# Patient Record
Sex: Male | Born: 1995 | Race: Black or African American | Hispanic: No | Marital: Single | Smoking: Current some day smoker
Health system: Southern US, Community
[De-identification: ages and names within clinical notes are randomized; demographics above are authoritative.]

## PROBLEM LIST (undated history)

## (undated) DIAGNOSIS — S62102A Fracture of unspecified carpal bone, left wrist, initial encounter for closed fracture: Secondary | ICD-10-CM

## (undated) HISTORY — PX: OTHER SURGICAL HISTORY: SHX169

---
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
## 2017-07-29 ENCOUNTER — Encounter (HOSPITAL_COMMUNITY)
Admission: RE | Disposition: A | Discharge status: Home / Self Care | Payer: Self-pay | Source: Ambulatory Visit | Attending: Orthopedic Surgery

## 2017-07-29 ENCOUNTER — Encounter (HOSPITAL_COMMUNITY)
Admission: EM | Disposition: A | Discharge status: Other Acute Inpatient Hospital | Payer: Self-pay | Source: Home / Self Care | Attending: Emergency Medicine

## 2017-07-29 ENCOUNTER — Encounter (HOSPITAL_COMMUNITY): Payer: Self-pay | Admitting: Surgery

## 2017-07-29 ENCOUNTER — Encounter (HOSPITAL_COMMUNITY): Payer: Self-pay | Admitting: Emergency Medicine

## 2017-07-29 ENCOUNTER — Emergency Department (HOSPITAL_COMMUNITY): Payer: Self-pay

## 2017-07-29 ENCOUNTER — Ambulatory Visit (HOSPITAL_COMMUNITY): Payer: Self-pay | Admitting: Anesthesiology

## 2017-07-29 ENCOUNTER — Emergency Department (HOSPITAL_COMMUNITY)
Admission: EM | Admit: 2017-07-29 | Discharge: 2017-07-29 | Payer: Self-pay | Attending: Emergency Medicine | Admitting: Emergency Medicine

## 2017-07-29 ENCOUNTER — Other Ambulatory Visit: Payer: Self-pay

## 2017-07-29 ENCOUNTER — Ambulatory Visit (HOSPITAL_COMMUNITY)
Admission: RE | Admit: 2017-07-29 | Discharge: 2017-07-29 | Disposition: A | Discharge status: Home / Self Care | Payer: Self-pay | Source: Ambulatory Visit | Attending: Orthopedic Surgery | Admitting: Orthopedic Surgery

## 2017-07-29 DIAGNOSIS — Z7722 Contact with and (suspected) exposure to environmental tobacco smoke (acute) (chronic): Secondary | ICD-10-CM | POA: Insufficient documentation

## 2017-07-29 DIAGNOSIS — W2209XA Striking against other stationary object, initial encounter: Secondary | ICD-10-CM | POA: Insufficient documentation

## 2017-07-29 DIAGNOSIS — Y999 Unspecified external cause status: Secondary | ICD-10-CM | POA: Insufficient documentation

## 2017-07-29 DIAGNOSIS — X58XXXA Exposure to other specified factors, initial encounter: Secondary | ICD-10-CM | POA: Insufficient documentation

## 2017-07-29 DIAGNOSIS — S62315A Displaced fracture of base of fourth metacarpal bone, left hand, initial encounter for closed fracture: Secondary | ICD-10-CM | POA: Insufficient documentation

## 2017-07-29 DIAGNOSIS — S62395A Other fracture of fourth metacarpal bone, left hand, initial encounter for closed fracture: Secondary | ICD-10-CM | POA: Insufficient documentation

## 2017-07-29 DIAGNOSIS — S62309A Unspecified fracture of unspecified metacarpal bone, initial encounter for closed fracture: Secondary | ICD-10-CM

## 2017-07-29 DIAGNOSIS — S62397A Other fracture of fifth metacarpal bone, left hand, initial encounter for closed fracture: Secondary | ICD-10-CM | POA: Insufficient documentation

## 2017-07-29 DIAGNOSIS — S62317A Displaced fracture of base of fifth metacarpal bone. left hand, initial encounter for closed fracture: Secondary | ICD-10-CM | POA: Insufficient documentation

## 2017-07-29 DIAGNOSIS — Y929 Unspecified place or not applicable: Secondary | ICD-10-CM | POA: Insufficient documentation

## 2017-07-29 DIAGNOSIS — Y939 Activity, unspecified: Secondary | ICD-10-CM | POA: Insufficient documentation

## 2017-07-29 HISTORY — PX: PERCUTANEOUS PINNING: SHX2209

## 2017-07-29 SURGERY — CLOSED REDUCTION, FINGER, WITH PERCUTANEOUS PINNING
Anesthesia: Choice | Laterality: Left

## 2017-07-29 SURGERY — PINNING, EXTREMITY, PERCUTANEOUS
Anesthesia: General | Site: Arm Lower | Laterality: Left

## 2017-07-29 MED ORDER — MIDAZOLAM HCL 2 MG/2ML IJ SOLN: 0.5000 mg | Freq: Once | INTRAMUSCULAR | Status: DC | PRN

## 2017-07-29 MED ORDER — GLYCOPYRROLATE 0.2 MG/ML IJ SOLN
INTRAMUSCULAR | Status: DC | PRN
  Administered 2017-07-29: 0.2 mg via INTRAVENOUS

## 2017-07-29 MED ORDER — OXYCODONE HCL 5 MG PO TABS
ORAL_TABLET | ORAL | Status: AC
  Filled 2017-07-29: qty 1

## 2017-07-29 MED ORDER — 0.9 % SODIUM CHLORIDE (POUR BTL) OPTIME
TOPICAL | Status: DC | PRN
  Administered 2017-07-29: 1000 mL

## 2017-07-29 MED ORDER — SUFENTANIL CITRATE 50 MCG/ML IV SOLN
INTRAVENOUS | Status: DC | PRN
  Administered 2017-07-29: 15 ug via INTRAVENOUS

## 2017-07-29 MED ORDER — PROPOFOL 10 MG/ML IV BOLUS
INTRAVENOUS | Status: AC
  Filled 2017-07-29: qty 20

## 2017-07-29 MED ORDER — MIDAZOLAM HCL 5 MG/5ML IJ SOLN
INTRAMUSCULAR | Status: DC | PRN
  Administered 2017-07-29 (×2): 2 mg via INTRAVENOUS

## 2017-07-29 MED ORDER — HYDROMORPHONE HCL 1 MG/ML IJ SOLN: 0.2500 mg | INTRAMUSCULAR | Status: DC | PRN

## 2017-07-29 MED ORDER — MEPERIDINE HCL 25 MG/ML IJ SOLN: 6.2500 mg | INTRAMUSCULAR | Status: DC | PRN

## 2017-07-29 MED ORDER — PROMETHAZINE HCL 25 MG/ML IJ SOLN: 6.2500 mg | INTRAMUSCULAR | Status: DC | PRN

## 2017-07-29 MED ORDER — MIDAZOLAM HCL 2 MG/2ML IJ SOLN
INTRAMUSCULAR | Status: AC
  Filled 2017-07-29: qty 2

## 2017-07-29 MED ORDER — DEXAMETHASONE SODIUM PHOSPHATE 10 MG/ML IJ SOLN
INTRAMUSCULAR | Status: DC | PRN
  Administered 2017-07-29: 10 mg via INTRAVENOUS

## 2017-07-29 MED ORDER — LACTATED RINGERS IV SOLN
INTRAVENOUS | Status: DC
  Administered 2017-07-29: 19:00:00 via INTRAVENOUS

## 2017-07-29 MED ORDER — OXYCODONE HCL 5 MG PO TABS
5.0000 mg | ORAL_TABLET | Freq: Once | ORAL | Status: AC
  Administered 2017-07-29: 5 mg via ORAL

## 2017-07-29 MED ORDER — LIDOCAINE 2% (20 MG/ML) 5 ML SYRINGE
INTRAMUSCULAR | Status: DC | PRN
  Administered 2017-07-29: 100 mg via INTRAVENOUS

## 2017-07-29 MED ORDER — SUCCINYLCHOLINE CHLORIDE 200 MG/10ML IV SOSY
PREFILLED_SYRINGE | INTRAVENOUS | Status: DC | PRN
  Administered 2017-07-29: 180 mg via INTRAVENOUS

## 2017-07-29 MED ORDER — SUFENTANIL CITRATE 50 MCG/ML IV SOLN
INTRAVENOUS | Status: AC
  Filled 2017-07-29: qty 1

## 2017-07-29 MED ORDER — PROPOFOL 10 MG/ML IV BOLUS
INTRAVENOUS | Status: DC | PRN
  Administered 2017-07-29: 100 mg via INTRAVENOUS
  Administered 2017-07-29: 200 mg via INTRAVENOUS

## 2017-07-29 MED ORDER — ONDANSETRON HCL 4 MG/2ML IJ SOLN
INTRAMUSCULAR | Status: DC | PRN
  Administered 2017-07-29: 4 mg via INTRAVENOUS

## 2017-07-29 MED ORDER — CEFAZOLIN SODIUM-DEXTROSE 2-3 GM-%(50ML) IV SOLR
INTRAVENOUS | Status: DC | PRN
  Administered 2017-07-29: 2 g via INTRAVENOUS

## 2017-07-29 SURGICAL SUPPLY — 41 items
BANDAGE ACE 3X5.8 VEL STRL LF (GAUZE/BANDAGES/DRESSINGS) ×3 IMPLANT
BANDAGE ACE 4X5 VEL STRL LF (GAUZE/BANDAGES/DRESSINGS) IMPLANT
BENZOIN TINCTURE PRP APPL 2/3 (GAUZE/BANDAGES/DRESSINGS) IMPLANT
BLADE CLIPPER SURG (BLADE) IMPLANT
BNDG GAUZE ELAST 4 BULKY (GAUZE/BANDAGES/DRESSINGS) ×12 IMPLANT
CLOSURE WOUND 1/2 X4 (GAUZE/BANDAGES/DRESSINGS)
COVER SURGICAL LIGHT HANDLE (MISCELLANEOUS) ×3 IMPLANT
CUFF TOURNIQUET SINGLE 18IN (TOURNIQUET CUFF) ×3 IMPLANT
CUFF TOURNIQUET SINGLE 24IN (TOURNIQUET CUFF) IMPLANT
DRAPE OEC MINIVIEW 54X84 (DRAPES) ×3 IMPLANT
DRSG EMULSION OIL 3X3 NADH (GAUZE/BANDAGES/DRESSINGS) IMPLANT
GAUZE SPONGE 4X4 12PLY STRL (GAUZE/BANDAGES/DRESSINGS) ×3 IMPLANT
GAUZE XEROFORM 1X8 LF (GAUZE/BANDAGES/DRESSINGS) ×6 IMPLANT
GLOVE BIOGEL M 8.0 STRL (GLOVE) ×3 IMPLANT
GLOVE SS BIOGEL STRL SZ 8 (GLOVE) ×1 IMPLANT
GLOVE SUPERSENSE BIOGEL SZ 8 (GLOVE) ×2
GOWN STRL REUS W/ TWL LRG LVL3 (GOWN DISPOSABLE) ×2 IMPLANT
GOWN STRL REUS W/ TWL XL LVL3 (GOWN DISPOSABLE) ×2 IMPLANT
GOWN STRL REUS W/TWL LRG LVL3 (GOWN DISPOSABLE) ×4
GOWN STRL REUS W/TWL XL LVL3 (GOWN DISPOSABLE) ×4
K-WIRE DBL TROCAR .062X4 ×6 IMPLANT
KIT BASIN OR (CUSTOM PROCEDURE TRAY) ×3 IMPLANT
KIT ROOM TURNOVER OR (KITS) ×3 IMPLANT
KWIRE DBL TROCAR .062X4 ×2 IMPLANT
MANIFOLD NEPTUNE II (INSTRUMENTS) ×3 IMPLANT
NS IRRIG 1000ML POUR BTL (IV SOLUTION) ×3 IMPLANT
PACK ORTHO EXTREMITY (CUSTOM PROCEDURE TRAY) ×3 IMPLANT
PAD ARMBOARD 7.5X6 YLW CONV (MISCELLANEOUS) ×6 IMPLANT
PAD CAST 4YDX4 CTTN HI CHSV (CAST SUPPLIES) ×2 IMPLANT
PADDING CAST COTTON 4X4 STRL (CAST SUPPLIES) ×4
SCOTCHCAST PLUS 2X4 WHITE (CAST SUPPLIES) ×6 IMPLANT
SCRUB BETADINE 4OZ XXX (MISCELLANEOUS) ×3 IMPLANT
SOL PREP POV-IOD 4OZ 10% (MISCELLANEOUS) ×3 IMPLANT
STRIP CLOSURE SKIN 1/2X4 (GAUZE/BANDAGES/DRESSINGS) IMPLANT
SUT ETHILON 4 0 P 3 18 (SUTURE) IMPLANT
SUT ETHILON 5 0 P 3 18 (SUTURE)
SUT NYLON ETHILON 5-0 P-3 1X18 (SUTURE) IMPLANT
SUT PROLENE 4 0 P 3 18 (SUTURE) IMPLANT
TOWEL OR 17X24 6PK STRL BLUE (TOWEL DISPOSABLE) ×3 IMPLANT
TOWEL OR 17X26 10 PK STRL BLUE (TOWEL DISPOSABLE) ×3 IMPLANT
WATER STERILE IRR 1000ML POUR (IV SOLUTION) ×3 IMPLANT

## 2017-07-29 NOTE — Transfer of Care (Signed)
Immediate Anesthesia Transfer of Care Note  Patient: Dylan Dominguez  Procedure(s) Performed: CLOSED REDUCTIONA DN PINNING/LEFT (Left Arm Lower)  Patient Location: PACU  Anesthesia Type:General  Level of Consciousness: drowsy, patient cooperative and responds to stimulation  Airway & Oxygen Therapy: Patient Spontanous Breathing  Post-op Assessment: Report given to RN, Post -op Vital signs reviewed and stable and Patient moving all extremities X 4  Post vital signs: Reviewed and stable  Last Vitals:  Vitals:   07/29/17 1843 07/29/17 2045  BP: 133/76 119/61  Pulse:  75  Resp:  (!) 9  Temp:  (!) 36.4 C  SpO2:  98%    Last Pain: There were no vitals filed for this visit.       Complications: No apparent anesthesia complications

## 2017-07-29 NOTE — Discharge Instructions (Signed)
GO TO [facility]. TELL THEM AT THE DESK THAT YOU ARE THERE FOR SURGERY ON YOUR HAND.  DO NOT EAT OR DRINK ANYTHING.

## 2017-07-29 NOTE — ED Provider Notes (Signed)
[facility]-EMERGENCY DEPT Provider Note   CSN: [id number] Arrival date & time: 07/29/17  1617     History   Chief Complaint Chief Complaint  Patient presents with  . Hand Injury    HPI Dylan Dominguez is a 21 y.o. male who is left hand dominant, presents to the ED with left hand pain. Patient states he thinks it is broken. States "I like to get mad and punch shit". C/o swelling and pain. Patient denies any other injuries. Patient reports that the injury happened 3 days ago when he hit metal pole. Patient took ibuprofen but the pain and swelling continued.  HPI  History reviewed. No pertinent past medical history.  There are no active problems to display for this patient.   History reviewed. No pertinent surgical history.     Home Medications    Prior to Admission medications   Not on File    Family History No family history on file.  Social History Social History   Tobacco Use  . Smoking status: Passive Smoke Exposure - Never Smoker  . Smokeless tobacco: Never Used  Substance Use Topics  . Alcohol use: No  . Drug use: No     Allergies   Patient has no known allergies.   Review of Systems Review of Systems  Musculoskeletal: Positive for arthralgias.       Left hand pain  All other systems reviewed and are negative.    Physical Exam Updated Vital Signs BP 124/77   Pulse (!) 54   Temp 98.8 F (37.1 C) (Oral)   Resp 16   SpO2 100%   Physical Exam  Constitutional: He appears well-developed and well-nourished. No distress.  HENT:  Head: Normocephalic and atraumatic.  Eyes: EOM are normal.  Neck: Neck supple.  Cardiovascular: Bradycardia present.  Pulmonary/Chest: Effort normal.  Musculoskeletal:       Left hand: He exhibits tenderness, bony tenderness and swelling. He exhibits normal capillary refill and no laceration. Decreased range of motion: due to pain. Normal sensation noted. Normal strength noted. He exhibits no  thumb/finger opposition.  Radial pulse 2+. Swelling and tenderness to the dorsum of the left hand.   Neurological: He is alert.  Skin: Skin is warm and dry.  Psychiatric: He has a normal mood and affect.  Nursing note and vitals reviewed.    ED Treatments / Results  Labs (all labs ordered are listed, but only abnormal results are displayed) Labs Reviewed  HEMOGLOBIN A1C    Radiology Dg Hand Complete Left  Result Date: 07/29/2017 CLINICAL DATA:  Left hand pain. EXAM: LEFT HAND - COMPLETE 3+ VIEW COMPARISON:  None. FINDINGS: There is an oblique comminuted fracture of the proximal fourth metacarpal bone. Avulsion fracture of the base of the fifth metacarpal bone is also noted. Osseous fragments are seen dorsally at the base of the fourth metacarpal bone. There is suspected dorsal subluxation. Mild irregularity of the dorsal surface of the hamate may represent an avulsion fracture and the source of the osseous fragments. There is significant dorsal soft tissue swelling. IMPRESSION: Fracture dislocation of the fourth and fifth metacarpal bones. Suspected avulsion fracture of the hamate. Electronically Signed   By: Ted Mcalpineobrinka  Dimitrova M.D.   On: 07/29/2017 17:20    Procedures Procedures (including critical care time)  Medications Ordered in ED Medications - No data to display   Initial Impression / Assessment and Plan / ED Course  I have reviewed the triage vital signs and the nursing notes.  21 y.o. male with left hand injury that occurred 3 days ago. X-ray show fracture/dislocation of the 4th and 5th MC bones on the left.   Consult with Dr. Amanda PeaGramig and [facility].  [facility]. Patient mother is driving him and will take him now. Instructions to patient not to eat or drink anything.   Final Clinical Impressions(s) / ED Diagnoses   Final diagnoses:  Fracture of metacarpal bone of left hand     ED Discharge Orders    None       Kerrie Buffaloeese, Jeronica Stlouis DatelandM, NP 07/29/17 Flossie [id number]    Benjiman CorePickering, Nathan, MD 07/29/17 2342

## 2017-07-29 NOTE — Op Note (Signed)
See full dictation [phone] Status post closed reduction and pinning fourth and fifth carpometacarpal fracture dislocations He was given Ancef preop without difficulty To be monitored in the recovery room and DC'd home. I'll see him in the office in 14 days We'll plan for pin removal at 4 weeks Hasaan Radde M.D.

## 2017-07-29 NOTE — Anesthesia Procedure Notes (Signed)
Procedure Name: Intubation Date/Time: 07/29/2017 8:04 PM Performed by: Claris Che, CRNA Pre-anesthesia Checklist: Patient identified, Emergency Drugs available, Suction available, Patient being monitored and Timeout performed Patient Re-evaluated:Patient Re-evaluated prior to induction Oxygen Delivery Method: Circle system utilized Preoxygenation: Pre-oxygenation with 100% oxygen Induction Type: IV induction, Rapid sequence and Cricoid Pressure applied Laryngoscope Size: Mac and 3 Grade View: Grade I Tube type: Oral Tube size: 8.0 mm Number of attempts: 1 Airway Equipment and Method: Stylet Placement Confirmation: ETT inserted through vocal cords under direct vision and positive ETCO2 Secured at: 24 cm Tube secured with: Tape Dental Injury: Teeth and Oropharynx as per pre-operative assessment

## 2017-07-29 NOTE — H&P (Signed)
Ronie SpiesCameron O Glassner is an 21 y.o. male.   Chief Complaint: Subacute fracture dislocation left fourth and fifth CMC joints   HPI: Patient presents for evaluation treatment fourth and fifth fracture dislocation left hand. This was sustained Friday. He notes no other injury. He is here with his family. I discussed all issues with his mother.  They note that he does not have any obvious allergies. They note that he does not typically take medicine.  The patient states she is currently not working. He is he works with his hands.  I reviewed all issues at length and the findings.  After reviewing his findings I recommended closed reduction versus open reduction and fixation is necessary given the displacement and disarray of bony architecture  History reviewed. No pertinent past medical history.  Past Surgical History:  Procedure Laterality Date  . arm fracture Right     History reviewed. No pertinent family history. Social History:  reports that he is a non-smoker but has been exposed to tobacco smoke. he has never used smokeless tobacco. He reports that he does not drink alcohol or use drugs.  Allergies: No Known Allergies  Medications Prior to Admission  Medication Sig Dispense Refill  . ibuprofen (ADVIL,MOTRIN) 200 MG tablet Take 800 mg by mouth every 6 (six) hours as needed for mild pain.      No results found for this or any previous visit (from the past 48 hour(s)). Dg Hand Complete Left  Result Date: 07/29/2017 CLINICAL DATA:  Left hand pain. EXAM: LEFT HAND - COMPLETE 3+ VIEW COMPARISON:  None. FINDINGS: There is an oblique comminuted fracture of the proximal fourth metacarpal bone. Avulsion fracture of the base of the fifth metacarpal bone is also noted. Osseous fragments are seen dorsally at the base of the fourth metacarpal bone. There is suspected dorsal subluxation. Mild irregularity of the dorsal surface of the hamate may represent an avulsion fracture and the source of the  osseous fragments. There is significant dorsal soft tissue swelling. IMPRESSION: Fracture dislocation of the fourth and fifth metacarpal bones. Suspected avulsion fracture of the hamate. Electronically Signed   By: Ted Mcalpineobrinka  Dimitrova M.D.   On: 07/29/2017 17:20    Review of Systems  Respiratory: Negative.   Cardiovascular: Negative.   Gastrointestinal: Negative.   Genitourinary: Negative.     Blood pressure 133/76. Physical Exam  Fourth and fifth fracture dislocations about the carpometacarpal joints displaced in nature. He is intact to sensation and motor function. The patient is alert and oriented in no acute distress. The patient complains of pain in the affected upper extremity.  The patient is noted to have a normal HEENT exam. Lung fields show equal chest expansion and no shortness of breath. Abdomen exam is nontender without distention. Lower extremity examination does not show any fracture dislocation or blood clot symptoms. Pelvis is stable and the neck and back are stable and nontender. Assessment/Plan We will plan for closed versus open reduction and pinning fracture dislocation fourth and fifth CMC joints. We are planning surgery for your upper extremity. The risk and benefits of surgery to include risk of bleeding, infection, anesthesia,  damage to normal structures and failure of the surgery to accomplish its intended goals of relieving symptoms and restoring function have been discussed in detail. With this in mind we plan to proceed. I have specifically discussed with the patient the pre-and postoperative regime and the dos and don'ts and risk and benefits in great detail. Risk and benefits of surgery also  include risk of dystrophy(CRPS), chronic nerve pain, failure of the healing process to go onto completion and other inherent risks of surgery The relavent the pathophysiology of the disease/injury process, as well as the alternatives for treatment and postoperative course of  action has been discussed in great detail with the patient who desires to proceed.  We will do everything in our power to help you (the patient) restore function to the upper extremity. It is a pleasure to see this patient today.  Oletta CohnWilliam M Eion Timbrook III, MD 07/29/2017, 6:56 PM

## 2017-07-29 NOTE — ED Triage Notes (Signed)
Patient here from home with complaints of left hand pain. States that he thinks it is broken. Reports "I like to get mad and punch shit". Swelling noted.

## 2017-07-29 NOTE — Anesthesia Preprocedure Evaluation (Addendum)
Anesthesia Evaluation  Patient identified by MRN, date of birth, ID band Patient awake    Reviewed: Allergy & Precautions, NPO status , Patient's Chart, lab work & pertinent test results  History of Anesthesia Complications Negative for: history of anesthetic complications  Airway Mallampati: I  TM Distance: >3 FB Neck ROM: Full    Dental  (+) Dental Advisory Given   Pulmonary Current Smoker,    breath sounds clear to auscultation       Cardiovascular negative cardio ROS   Rhythm:Regular Rate:Normal     Neuro/Psych negative neurological ROS     GI/Hepatic negative GI ROS, (+)     substance abuse  marijuana use,   Endo/Other  negative endocrine ROS  Renal/GU negative Renal ROS     Musculoskeletal   Abdominal   Peds  Hematology negative hematology ROS (+)   Anesthesia Other Findings   Reproductive/Obstetrics                            Anesthesia Physical Anesthesia Plan  ASA: II  Anesthesia Plan: General   Post-op Pain Management:    Induction: Intravenous  PONV Risk Score and Plan: 1 and Ondansetron  Airway Management Planned: Oral ETT  Additional Equipment:   Intra-op Plan:   Post-operative Plan: Extubation in OR  Informed Consent: I have reviewed the patients History and Physical, chart, labs and discussed the procedure including the risks, benefits and alternatives for the proposed anesthesia with the patient or authorized representative who has indicated his/her understanding and acceptance.   Dental advisory given  Plan Discussed with: CRNA and Surgeon  Anesthesia Plan Comments: (Plan routine monitors, GETA)        Anesthesia Quick Evaluation

## 2017-07-29 NOTE — Discharge Instructions (Signed)
Keep the cast clean and dry and elevate her arm please will see you in the office in 2 weeks. Please call my office at [phone] to see me in 2 weeks-Dr. Amanda PeaGramig  We recommend that you to take vitamin C 1000 mg a day to promote healing. We also recommend that if you require  pain medicine that you take a stool softener to prevent constipation as most pain medicines will have constipation side effects. We recommend either Peri-Colace or Senokot and recommend that you also consider adding MiraLAX as well to prevent the constipation affects from pain medicine if you are required to use them. These medicines are over the counter and may be purchased at a local pharmacy. A cup of yogurt and a probiotic can also be helpful during the recovery process as the medicines can disrupt your intestinal environment. Keep bandage clean and dry.  Call for any problems.  No smoking.  Criteria for driving a car: you should be off your pain medicine for 7-8 hours, able to drive one handed(confident), thinking clearly and feeling able in your judgement to drive. Continue elevation as it will decrease swelling.  If instructed by MD move your fingers within the confines of the bandage/splint.  Use ice if instructed by your MD. Call immediately for any sudden loss of feeling in your hand/arm or change in functional abilities of the extremity.   General Anesthesia, Adult, Care After These instructions provide you with information about caring for yourself after your procedure. Your health care provider may also give you more specific instructions. Your treatment has been planned according to current medical practices, but problems sometimes occur. Call your health care provider if you have any problems or questions after your procedure. What can I expect after the procedure? After the procedure, it is common to have:  Vomiting.  A sore throat.  Mental slowness.  It is common to feel:  Nauseous.  Cold or  shivery.  Sleepy.  Tired.  Sore or achy, even in parts of your body where you did not have surgery.  Follow these instructions at home: For at least 24 hours after the procedure:  Do not: ? Participate in activities where you could fall or become injured. ? Drive. ? Use heavy machinery. ? Drink alcohol. ? Take sleeping pills or medicines that cause drowsiness. ? Make important decisions or sign legal documents. ? Take care of children on your own.  Rest. Eating and drinking  If you vomit, drink water, juice, or soup when you can drink without vomiting.  Drink enough fluid to keep your urine clear or pale yellow.  Make sure you have little or no nausea before eating solid foods.  Follow the diet recommended by your health care provider. General instructions  Have a responsible adult stay with you until you are awake and alert.  Return to your normal activities as told by your health care provider. Ask your health care provider what activities are safe for you.  Take over-the-counter and prescription medicines only as told by your health care provider.  If you smoke, do not smoke without supervision.  Keep all follow-up visits as told by your health care provider. This is important. Contact a health care provider if:  You continue to have nausea or vomiting at home, and medicines are not helpful.  You cannot drink fluids or start eating again.  You cannot urinate after 8-12 hours.  You develop a skin rash.  You have fever.  You have increasing  redness at the site of your procedure. Get help right away if:  You have difficulty breathing.  You have chest pain.  You have unexpected bleeding.  You feel that you are having a life-threatening or urgent problem. This information is not intended to replace advice given to you by your health care provider. Make sure you discuss any questions you have with your health care provider. Document Released: 11/25/2000  Document Revised: 01/22/2016 Document Reviewed: 08/03/2015 Elsevier Interactive Patient Education  [employer]2018 [employer]

## 2017-07-30 ENCOUNTER — Encounter (HOSPITAL_COMMUNITY): Payer: Self-pay | Admitting: Orthopedic Surgery

## 2017-07-30 NOTE — Anesthesia Postprocedure Evaluation (Signed)
Anesthesia Post Note  Patient: Ronie SpiesCameron O Albert  Procedure(s) Performed: CLOSED REDUCTIONA DN PINNING/LEFT (Left Arm Lower)     Patient location during evaluation: PACU Anesthesia Type: General Level of consciousness: awake and alert, patient cooperative and oriented Pain management: pain level controlled Vital Signs Assessment: post-procedure vital signs reviewed and stable Respiratory status: spontaneous breathing, nonlabored ventilation and respiratory function stable Cardiovascular status: blood pressure returned to baseline and stable Postop Assessment: no apparent nausea or vomiting Anesthetic complications: no    Last Vitals:  Vitals:   07/29/17 2130 07/29/17 2145  BP: 115/70 122/82  Pulse: 66 (!) 55  Resp: 15 13  Temp:  36.7 C  SpO2: 100% 99%    Last Pain:  Vitals:   07/29/17 2145  PainSc: 2                  Vaness Jelinski,E. Jama Mcmiller

## 2017-07-30 NOTE — Op Note (Signed)
NAME:  Dylan Dominguez, Dylan Dominguez                   ACCOUNT NO.:  MEDICAL RECORD NO.:  [id number]  LOCATION:                                 FACILITY:  PHYSICIAN:  Dionne AnoWilliam M. Amanda PeaGramig, M.D.     DATE OF BIRTH:  DATE OF PROCEDURE: DATE OF DISCHARGE:                              OPERATIVE REPORT   PREOPERATIVE DIAGNOSIS:  Fourth and fifth carpometacarpal fracture dislocations, left hand.  POSTOPERATIVE DIAGNOSIS:  Fourth and fifth carpometacarpal fracture dislocations, left hand.  PROCEDURE: 1. Fourth carpometacarpal fracture dislocation closed reduction and     pinning. 2. Fifth carpometacarpal fracture dislocation closed reduction and     pinning, left hand/wrist. 3. Five-view radiographic series, left hand.  SURGEON:  Dionne AnoWilliam M. Amanda PeaGramig, M.D.  ASSISTANT:  None.  COMPLICATIONS:  None.  ANESTHESIA:  General.  TOURNIQUET TIME:  Zero.  INDICATIONS:  A 21 year old male with subacute fracture dislocations.  I have counseled him in regard to risks and benefits of surgery and open versus closed reduction and pinning.  He understands risks and benefits and desires to proceed.  OPERATION IN DETAIL:  The patient was seen by myself and Anesthesia. Taken to the operative theater, underwent smooth induction of general anesthetic, prepped and draped in usual sterile fashion with Hibiclens pre-scrub, followed by a Betadine scrub and paint, followed by sterile draping and time-out being observed.  He was given Ancef.  He then underwent a closed reduction.  I was able to close reduce and pin the fourth and fifth CMC fracture dislocations without difficulty utilizing two 0.062 Kirschner wires, which were bent below the skin surface and will be removed at 4 weeks postop.  Following this, I very carefully cleansed the hand and placed Xeroform, followed by a short-arm cast on him.  He had excellent refill of soft compartments and no complicating features.  He will be monitored in recovery room,  discharged to home. We will see him back in the office in 2 weeks.     Dionne AnoWilliam M. Amanda PeaGramig, M.D.     [facility]  D:  07/29/2017  T:  07/30/2017  Job:  [id number]

## 2017-11-19 ENCOUNTER — Encounter (HOSPITAL_COMMUNITY): Payer: Self-pay | Admitting: Emergency Medicine

## 2017-11-19 ENCOUNTER — Emergency Department (HOSPITAL_COMMUNITY)
Admission: EM | Admit: 2017-11-19 | Discharge: 2017-11-19 | Discharge status: Against Medical Advice | Payer: Self-pay | Attending: Emergency Medicine | Admitting: Emergency Medicine

## 2017-11-19 ENCOUNTER — Emergency Department (HOSPITAL_COMMUNITY): Payer: Self-pay

## 2017-11-19 ENCOUNTER — Other Ambulatory Visit: Payer: Self-pay

## 2017-11-19 DIAGNOSIS — S62309A Unspecified fracture of unspecified metacarpal bone, initial encounter for closed fracture: Secondary | ICD-10-CM

## 2017-11-19 DIAGNOSIS — IMO0001 Reserved for inherently not codable concepts without codable children: Secondary | ICD-10-CM

## 2017-11-19 DIAGNOSIS — Z9889 Other specified postprocedural states: Secondary | ICD-10-CM

## 2017-11-19 DIAGNOSIS — Z7722 Contact with and (suspected) exposure to environmental tobacco smoke (acute) (chronic): Secondary | ICD-10-CM | POA: Insufficient documentation

## 2017-11-19 DIAGNOSIS — Z472 Encounter for removal of internal fixation device: Secondary | ICD-10-CM | POA: Insufficient documentation

## 2017-11-19 NOTE — ED Provider Notes (Signed)
[facility] Provider Note   CSN: [id number] Arrival date & time: 11/19/17  1444     History   Chief Complaint Chief Complaint  Patient presents with  . Hand Injury  . Post-op Problem    HPI Dylan Dominguez is a 22 y.o. male presenting to the ED with foreign body left hand. Pt states he broke his hand in November, and had it fixed by Dr. Amanda PeaGramig. States every since procedure he has had a pin sticking out from his skin. He states today he bumped the side of his hand gently by accident and it began bleeding. He reports some purulent drainage. Denies F/C. Per chart review, pt is s/p closed reduction of left 4th and 5th carpometacarpal fracture/dislocation on 07/29/17 by Dr. Amanda PeaGramig. Pt states he never followed up following the procedure. Per chart review, Dr. Carlos LeveringGramig's op note stating 2 week follow up and pins removed 4 weeks from procedure date. Pt reportedly did not have Dr. Carlos LeveringGramig's contact information, nor did he make an effort to contact his office.  The history is provided by the patient.    History reviewed. No pertinent past medical history.  There are no active problems to display for this patient.   Past Surgical History:  Procedure Laterality Date  . arm fracture Right   . PERCUTANEOUS PINNING Left 07/29/2017   Procedure: CLOSED REDUCTIONA DN PINNING/LEFT;  Surgeon: Dominica SeverinGramig, William, MD;  Location: [facility] OR;  Service: Orthopedics;  Laterality: Left;       Home Medications    Prior to Admission medications   Medication Sig Start Date End Date Taking? Authorizing Provider  ibuprofen (ADVIL,MOTRIN) 200 MG tablet Take 800 mg by mouth every 6 (six) hours as needed for mild pain.    [provider]    Family History No family history on file.  Social History Social History   Tobacco Use  . Smoking status: Passive Smoke Exposure - Never Smoker  . Smokeless tobacco: Never Used  Substance Use Topics  . Alcohol use: No  .  Drug use: No     Allergies   Patient has no known allergies.   Review of Systems Review of Systems  Constitutional: Negative for fever.  Musculoskeletal: Positive for myalgias.  Skin: Positive for wound.  Neurological: Negative for numbness.     Physical Exam Updated Vital Signs BP 106/79   Pulse (!) 111   Temp 97.7 F (36.5 C) (Oral)   Ht 5\' 9"  (1.753 m)   Wt 74.8 kg (165 lb)   SpO2 94%   BMI 24.37 kg/m   Physical Exam  Constitutional: He appears well-developed and well-nourished. No distress.  HENT:  Head: Normocephalic and atraumatic.  Eyes: Conjunctivae are normal.  Cardiovascular: Intact distal pulses.  Pulmonary/Chest: Effort normal.  Musculoskeletal:       Hands: Medial aspect left hand with metal pin protruding from skin. No surrounding erythema, clear serous fluid expressed, no purulent drainage expressed. No significant tenderness or swelling. Normal ROM of digits with normal sensation and pulses.  Psychiatric: He has a normal mood and affect. His behavior is normal.  Nursing note and vitals reviewed.    ED Treatments / Results  Labs (all labs ordered are listed, but only abnormal results are displayed) Labs Reviewed - No data to display  EKG  EKG Interpretation None       Radiology Dg Hand Complete Left  Result Date: 11/19/2017 CLINICAL DATA:  Left hand surgery November 2018. Surgical pin  protruding through the skin. EXAM: LEFT HAND - COMPLETE 3+ VIEW COMPARISON:  07/29/2017 FINDINGS: Two K-wires transfixing the base of the fifth metacarpal. Interval healing of the fifth metacarpal fracture. One of the K-wires protrudes through the skin surface. Partially healed fracture of proximal fourth metacarpal shaft with persistent lucency. No other acute fracture or dislocation. IMPRESSION: 1. Two K-wires transfixing the base of the fifth metacarpal. Interval healing of the fifth metacarpal fracture. One of the K-wires protrudes through the skin surface.  2. Partially healed fracture of proximal fourth metacarpal shaft with persistent lucency. Electronically Signed   By: Elige Ko   On: 11/19/2017 18:37    Procedures Procedures (including critical care time)  Medications Ordered in ED Medications - No data to display   Initial Impression / Assessment and Plan / ED Course  I have reviewed the triage vital signs and the nursing notes.  Pertinent labs & imaging results that were available during my care of the patient were reviewed by me and considered in my medical decision making (see chart for details).  Clinical Course as of Nov 19 2004  Wed Nov 19, 2017  1945 Spoke with Steward Drone, who works with Dr. Amanda Pea. She discussed pt with Dr. Shon Baton who recommended pt report to clinic tomorrow morning to be seen by Dr. Amanda Pea. Discussed this plan with patient and he was upset that I would not remove pins in the ED today. Discussed that this was not appropriate for the ED and he would need to be seen by his orthopedic surgeon. Pt was angry and left during this discussion, before receiving discharge papers.  [JR]    Clinical Course User Index [JR] Robinson, [place] N, PA-C   Pt presenting to the ED 4 months s/p closed fixation of left 4th and 5th carpometacarpal fx done by Dr. Amanda Pea. Pt never reported for outpatient follow up following procedure for pin removal, and reports today requesting they be removed. Xray showing 2 k-wires present, 1 protruding from skin and the other is not. On exam, no evidence of infection, afebrile, no purulent drainage or erythema. NV intact. Discussed this patient with PA at Dr. Carlos Levering office, who recommended patient report to clinic first thing in the morning to be seen by Dr. Amanda Pea. Discussed this plan with patient and he was unhappy with plan, requesting pins be removed immediately in the ED. Informed patient this was a procedure that needed to be performed by his orthopedic surgeon. He left ED prior to receiving  discharge paperwork, but stated he now had contact information for his clinic to follow up.   Discussed results, findings, treatment and follow up. Patient advised of return precautions. Patient verbalized understanding and agreed with plan.  Final Clinical Impressions(s) / ED Diagnoses   Final diagnoses:  Status post closed reduction with internal fixation  Fracture of metacarpal bone of left hand    ED Discharge Orders    None       Robinson, [place] N, PA-C 11/19/17 2006    Knapp, Jon, MD 11/20/17 1002

## 2017-11-19 NOTE — ED Notes (Signed)
Pt called for triage. No answer.

## 2017-11-19 NOTE — ED Triage Notes (Signed)
Pt in from home with c/o L hand post-op injury. States he had surgery by Dr. Amanda PeaGramig in December. Yesterday at work, pt states his L lateral hand started bleeding and pin appeared to be protruding from skin. VSS, bleeding controlled

## 2017-11-19 NOTE — ED Notes (Signed)
EDP at bedside. Patient leaving without his discharge papers. NAD noted. Patient ambulatory with steady gait.

## 2017-11-20 ENCOUNTER — Encounter (HOSPITAL_COMMUNITY)
Admission: RE | Disposition: A | Discharge status: Home / Self Care | Payer: Self-pay | Source: Ambulatory Visit | Attending: Orthopedic Surgery

## 2017-11-20 ENCOUNTER — Ambulatory Visit (HOSPITAL_COMMUNITY)
Admission: RE | Admit: 2017-11-20 | Discharge: 2017-11-20 | Disposition: A | Discharge status: Home / Self Care | Payer: Self-pay | Source: Ambulatory Visit | Attending: Orthopedic Surgery | Admitting: Orthopedic Surgery

## 2017-11-20 ENCOUNTER — Ambulatory Visit (HOSPITAL_COMMUNITY): Payer: Self-pay | Admitting: Anesthesiology

## 2017-11-20 ENCOUNTER — Encounter (HOSPITAL_COMMUNITY): Payer: Self-pay | Admitting: *Deleted

## 2017-11-20 DIAGNOSIS — Y838 Other surgical procedures as the cause of abnormal reaction of the patient, or of later complication, without mention of misadventure at the time of the procedure: Secondary | ICD-10-CM | POA: Insufficient documentation

## 2017-11-20 DIAGNOSIS — F17210 Nicotine dependence, cigarettes, uncomplicated: Secondary | ICD-10-CM | POA: Insufficient documentation

## 2017-11-20 DIAGNOSIS — T84290A Other mechanical complication of internal fixation device of bones of hand and fingers, initial encounter: Secondary | ICD-10-CM | POA: Insufficient documentation

## 2017-11-20 HISTORY — PX: I&D EXTREMITY: SHX5045

## 2017-11-20 HISTORY — DX: Fracture of unspecified carpal bone, left wrist, initial encounter for closed fracture: S62.102A

## 2017-11-20 HISTORY — PX: HARDWARE REMOVAL: SHX979

## 2017-11-20 LAB — CBC
HCT: 39.7 % (ref 39.0–52.0)
HEMOGLOBIN: 12.6 g/dL — AB (ref 13.0–17.0)
MCH: 23.9 pg — ABNORMAL LOW (ref 26.0–34.0)
MCHC: 31.7 g/dL (ref 30.0–36.0)
MCV: 75.3 fL — AB (ref 78.0–100.0)
PLATELETS: 132 10*3/uL — AB (ref 150–400)
RBC: 5.27 MIL/uL (ref 4.22–5.81)
RDW: 14.5 % (ref 11.5–15.5)
WBC: 6.8 10*3/uL (ref 4.0–10.5)

## 2017-11-20 SURGERY — IRRIGATION AND DEBRIDEMENT EXTREMITY
Anesthesia: General | Site: Hand | Laterality: Left

## 2017-11-20 MED ORDER — OXYCODONE HCL 5 MG/5ML PO SOLN: 5.0000 mg | Freq: Once | ORAL | Status: DC | PRN

## 2017-11-20 MED ORDER — CEFAZOLIN SODIUM-DEXTROSE 2-4 GM/100ML-% IV SOLN
2.0000 g | Freq: Once | INTRAVENOUS | Status: AC
  Administered 2017-11-20: 2 g via INTRAVENOUS

## 2017-11-20 MED ORDER — LACTATED RINGERS IV SOLN
INTRAVENOUS | Status: DC
Start: 2017-11-20 — End: 2017-11-20
  Administered 2017-11-20 (×2): via INTRAVENOUS

## 2017-11-20 MED ORDER — LIDOCAINE 2% (20 MG/ML) 5 ML SYRINGE
INTRAMUSCULAR | Status: DC | PRN
  Administered 2017-11-20: 100 mg via INTRAVENOUS

## 2017-11-20 MED ORDER — OXYCODONE HCL 5 MG PO TABS: 5.0000 mg | ORAL_TABLET | Freq: Once | ORAL | Status: DC | PRN

## 2017-11-20 MED ORDER — SODIUM CHLORIDE 0.9 % IR SOLN
Status: DC | PRN
  Administered 2017-11-20: 1000 mL

## 2017-11-20 MED ORDER — CEFAZOLIN SODIUM-DEXTROSE 2-4 GM/100ML-% IV SOLN
INTRAVENOUS | Status: AC
  Filled 2017-11-20: qty 100

## 2017-11-20 MED ORDER — PROPOFOL 10 MG/ML IV BOLUS
INTRAVENOUS | Status: DC | PRN
  Administered 2017-11-20: 200 mg via INTRAVENOUS

## 2017-11-20 MED ORDER — BUPIVACAINE HCL (PF) 0.25 % IJ SOLN
INTRAMUSCULAR | Status: AC
  Filled 2017-11-20: qty 10

## 2017-11-20 MED ORDER — HYDROMORPHONE HCL 1 MG/ML IJ SOLN
0.2500 mg | INTRAMUSCULAR | Status: DC | PRN
  Administered 2017-11-20 (×2): 0.25 mg via INTRAVENOUS

## 2017-11-20 MED ORDER — HYDROMORPHONE HCL 1 MG/ML IJ SOLN
INTRAMUSCULAR | Status: AC
  Administered 2017-11-20: 0.25 mg via INTRAVENOUS
  Filled 2017-11-20: qty 1

## 2017-11-20 MED ORDER — FENTANYL CITRATE (PF) 100 MCG/2ML IJ SOLN
INTRAMUSCULAR | Status: DC | PRN
  Administered 2017-11-20: 100 ug via INTRAVENOUS

## 2017-11-20 MED ORDER — PROMETHAZINE HCL 25 MG/ML IJ SOLN: 6.2500 mg | INTRAMUSCULAR | Status: DC | PRN

## 2017-11-20 MED ORDER — MEPERIDINE HCL 50 MG/ML IJ SOLN: 6.2500 mg | INTRAMUSCULAR | Status: DC | PRN

## 2017-11-20 SURGICAL SUPPLY — 60 items
BANDAGE ACE 3X5.8 VEL STRL LF (GAUZE/BANDAGES/DRESSINGS) IMPLANT
BANDAGE ACE 4X5 VEL STRL LF (GAUZE/BANDAGES/DRESSINGS) ×3 IMPLANT
BANDAGE ELASTIC 3 VELCRO ST LF (GAUZE/BANDAGES/DRESSINGS) ×3 IMPLANT
BNDG COHESIVE 1X5 TAN STRL LF (GAUZE/BANDAGES/DRESSINGS) IMPLANT
BNDG CONFORM 2 STRL LF (GAUZE/BANDAGES/DRESSINGS) IMPLANT
BNDG GAUZE ELAST 4 BULKY (GAUZE/BANDAGES/DRESSINGS) ×3 IMPLANT
CAP PIN ORTHO PINK (CAP) IMPLANT
CAP PIN PROTECTOR ORTHO WHT (CAP) IMPLANT
CORDS BIPOLAR (ELECTRODE) ×3 IMPLANT
COVER SURGICAL LIGHT HANDLE (MISCELLANEOUS) ×3 IMPLANT
CUFF TOURNIQUET SINGLE 18IN (TOURNIQUET CUFF) ×3 IMPLANT
CUFF TOURNIQUET SINGLE 24IN (TOURNIQUET CUFF) IMPLANT
DRAPE OEC MINIVIEW 54X84 (DRAPES) IMPLANT
DRAPE SURG 17X23 STRL (DRAPES) ×3 IMPLANT
DRSG ADAPTIC 3X8 NADH LF (GAUZE/BANDAGES/DRESSINGS) ×3 IMPLANT
DRSG EMULSION OIL 3X3 NADH (GAUZE/BANDAGES/DRESSINGS) ×3 IMPLANT
GAUZE SPONGE 2X2 8PLY STRL LF (GAUZE/BANDAGES/DRESSINGS) IMPLANT
GAUZE SPONGE 4X4 12PLY STRL (GAUZE/BANDAGES/DRESSINGS) ×3 IMPLANT
GAUZE SPONGE 4X4 12PLY STRL LF (GAUZE/BANDAGES/DRESSINGS) ×3 IMPLANT
GAUZE XEROFORM 1X8 LF (GAUZE/BANDAGES/DRESSINGS) ×3 IMPLANT
GLOVE BIOGEL M 8.0 STRL (GLOVE) ×3 IMPLANT
GLOVE SS BIOGEL STRL SZ 8 (GLOVE) ×1 IMPLANT
GLOVE SUPERSENSE BIOGEL SZ 8 (GLOVE) ×2
GOWN STRL REUS W/ TWL LRG LVL3 (GOWN DISPOSABLE) ×2 IMPLANT
GOWN STRL REUS W/ TWL XL LVL3 (GOWN DISPOSABLE) ×3 IMPLANT
GOWN STRL REUS W/TWL LRG LVL3 (GOWN DISPOSABLE) ×4
GOWN STRL REUS W/TWL XL LVL3 (GOWN DISPOSABLE) ×6
K-WIRE SMTH SNGL TROCAR .028X4 (WIRE)
KIT BASIN OR (CUSTOM PROCEDURE TRAY) ×3 IMPLANT
KIT ROOM TURNOVER OR (KITS) ×3 IMPLANT
KWIRE SMTH SNGL TROCAR .028X4 (WIRE) IMPLANT
MANIFOLD NEPTUNE II (INSTRUMENTS) ×3 IMPLANT
NEEDLE HYPO 25GX1X1/2 BEV (NEEDLE) IMPLANT
NS IRRIG 1000ML POUR BTL (IV SOLUTION) ×3 IMPLANT
PACK ORTHO EXTREMITY (CUSTOM PROCEDURE TRAY) ×3 IMPLANT
PAD ARMBOARD 7.5X6 YLW CONV (MISCELLANEOUS) ×6 IMPLANT
PAD CAST 4YDX4 CTTN HI CHSV (CAST SUPPLIES) ×1 IMPLANT
PADDING CAST ABS 3INX4YD NS (CAST SUPPLIES) ×2
PADDING CAST ABS COTTON 3X4 (CAST SUPPLIES) ×1 IMPLANT
PADDING CAST COTTON 4X4 STRL (CAST SUPPLIES) ×2
SCRUB BETADINE 4OZ XXX (MISCELLANEOUS) ×3 IMPLANT
SOL PREP POV-IOD 4OZ 10% (MISCELLANEOUS) ×6 IMPLANT
SPONGE GAUZE 2X2 STER 10/PKG (GAUZE/BANDAGES/DRESSINGS)
SPONGE LAP 4X18 X RAY DECT (DISPOSABLE) ×3 IMPLANT
SUCTION FRAZIER HANDLE 10FR (MISCELLANEOUS)
SUCTION TUBE FRAZIER 10FR DISP (MISCELLANEOUS) IMPLANT
SUT CHROMIC 4 0 PS 5 (SUTURE) ×3 IMPLANT
SUT MERSILENE 4 0 P 3 (SUTURE) IMPLANT
SUT PROLENE 4 0 PS 2 18 (SUTURE) IMPLANT
SUT VIC AB 2-0 CT1 27 (SUTURE)
SUT VIC AB 2-0 CT1 TAPERPNT 27 (SUTURE) IMPLANT
SWAB CULTURE ESWAB REG 1ML (MISCELLANEOUS) IMPLANT
SYR CONTROL 10ML LL (SYRINGE) IMPLANT
TOWEL OR 17X24 6PK STRL BLUE (TOWEL DISPOSABLE) ×3 IMPLANT
TOWEL OR 17X26 10 PK STRL BLUE (TOWEL DISPOSABLE) ×3 IMPLANT
TUBE CONNECTING 12'X1/4 (SUCTIONS) ×1
TUBE CONNECTING 12X1/4 (SUCTIONS) ×2 IMPLANT
UNDERPAD 30X30 (UNDERPADS AND DIAPERS) ×3 IMPLANT
WATER STERILE IRR 1000ML POUR (IV SOLUTION) ×3 IMPLANT
YANKAUER SUCT BULB TIP NO VENT (SUCTIONS) ×3 IMPLANT

## 2017-11-20 NOTE — H&P (Signed)
Dylan Dominguez is an 22 y.o. male.   Chief Complaint: Patient presents for deep hardware removal. HPI: Patient has undergone surgical intervention in the form of pinning unstable CMC fracture dislocations November 2018.  He never followed up for any postop surgical management.  Despite this he is miraculously rehabilitated himself and done quite well.  He has stable exam but has protruding pins both superficial pins and deep.  His deep pins have migrated.  There is no signs of infection.  I would recommend pin removal Tenolysis and repair as necessary.  Past Medical History:  Diagnosis Date  . Left wrist fracture     Past Surgical History:  Procedure Laterality Date  . arm fracture Right   . PERCUTANEOUS PINNING Left 07/29/2017   Procedure: CLOSED REDUCTIONA DN PINNING/LEFT;  Surgeon: Dylan Severin, MD;  Location: [facility] OR;  Service: Orthopedics;  Laterality: Left;    History reviewed. No pertinent family history. Social History:  reports that he has been smoking.  He has been smoking about 0.50 packs per day. He has never used smokeless tobacco. He reports that he drinks alcohol. He reports that he does not use drugs.  Allergies: No Known Allergies  Medications Prior to Admission  Medication Sig Dispense Refill  . diclofenac (VOLTAREN) 75 MG EC tablet Take 75 mg by mouth daily as needed for mild pain.    Marland Kitchen ibuprofen (ADVIL,MOTRIN) 200 MG tablet Take 800 mg by mouth every 6 (six) hours as needed for mild pain.      Results for orders placed or performed during the hospital encounter of 11/20/17 (from the past 48 hour(s))  CBC     Status: Abnormal   Collection Time: 11/20/17 12:13 PM  Result Value Ref Range   WBC 6.8 4.0 - 10.5 K/uL   RBC 5.27 4.22 - 5.81 MIL/uL   Hemoglobin 12.6 (L) 13.0 - 17.0 g/dL   HCT 16.1 09.6 - 04.5 %   MCV 75.3 (L) 78.0 - 100.0 fL   MCH 23.9 (L) 26.0 - 34.0 pg   MCHC 31.7 30.0 - 36.0 g/dL   RDW 40.9 81.1 - 91.4 %   Platelets 132 (L) 150 - 400 K/uL   Comment: Performed at [facility], [address], [place], [place] [address]   Dg Hand Complete Left  Result Date: 11/19/2017 CLINICAL DATA:  Left hand surgery November 2018. Surgical pin protruding through the skin. EXAM: LEFT HAND - COMPLETE 3+ VIEW COMPARISON:  07/29/2017 FINDINGS: Two K-wires transfixing the base of the fifth metacarpal. Interval healing of the fifth metacarpal fracture. One of the K-wires protrudes through the skin surface. Partially healed fracture of proximal fourth metacarpal shaft with persistent lucency. No other acute fracture or dislocation. IMPRESSION: 1. Two K-wires transfixing the base of the fifth metacarpal. Interval healing of the fifth metacarpal fracture. One of the K-wires protrudes through the skin surface. 2. Partially healed fracture of proximal fourth metacarpal shaft with persistent lucency. Electronically Signed   By: Elige Ko   On: 11/19/2017 18:37    Review of Systems  Eyes: Negative.   Respiratory: Negative.   Cardiovascular: Negative.   Gastrointestinal: Negative.   Genitourinary: Negative.     Blood pressure 116/82, pulse (!) 57, temperature 98.3 F (36.8 C), temperature source Oral, resp. rate 20, height 5\' 9"  (1.753 m), weight 74.8 kg (165 lb), SpO2 100 %. Physical Exam  Patient has significant deep hardware.  We will plan for hardware removal about the left wrist.  Fortunately  his fracture dislocation looks excellent he has managed to regain full flexion and extension no evidence of infection dystrophy or vascular compromise.  I discussed with patient all issues plans and concerns.  The patient is alert and oriented in no acute distress. The patient complains of pain in the affected upper extremity.  The patient is noted to have a normal HEENT exam. Lung fields show equal chest expansion and no shortness of breath. Abdomen exam is nontender without distention. Lower extremity examination does not show any fracture dislocation  or blood clot symptoms. Pelvis is stable and the neck and back are stable and nontender. Assessment/Plan We will plan for deep hardware removal.  We have had a long the surgical discussion.  We are planning surgery for your upper extremity. The risk and benefits of surgery to include risk of bleeding, infection, anesthesia,  damage to normal structures and failure of the surgery to accomplish its intended goals of relieving symptoms and restoring function have been discussed in detail. With this in mind we plan to proceed. I have specifically discussed with the patient the pre-and postoperative regime and the dos and don'ts and risk and benefits in great detail. Risk and benefits of surgery also include risk of dystrophy(CRPS), chronic nerve pain, failure of the healing process to go onto completion and other inherent risks of surgery The relavent the pathophysiology of the disease/injury process, as well as the alternatives for treatment and postoperative course of action has been discussed in great detail with the patient who desires to proceed.  We will do everything in our power to help you (the patient) restore function to the upper extremity. It is a pleasure to see this patient today.   Despite very poor follow-up and ignoring all of my instructions the patient has managed to have a reasonably good outcome.  With one pin superficially protruding in 1 Pin very deeply located I would recommend deep hardware removal and cursory irrigation and debridement of the open area  Dylan CohnWilliam M Joushua [name] III, MD 11/20/2017, 4:44 PM

## 2017-11-20 NOTE — Discharge Instructions (Signed)

## 2017-11-20 NOTE — H&P (Signed)
Dylan SpiesCameron O Dominguez is an 22 y.o. male.   Chief Complaint: Retained hardware HPI: Dylan Dominguez underwent CRPP of his left hand by Dr. Amanda PeaGramig in November 2018. He failed to follow up and in fact left the state. He returned and desires his hand hardware removed. He notes no problems except for the protrusion of it. He works in a [employer], [employer] and has nearly full function and sensation.  Past Medical History:  Diagnosis Date  . Left wrist fracture     Past Surgical History:  Procedure Laterality Date  . arm fracture Right   . PERCUTANEOUS PINNING Left 07/29/2017   Procedure: CLOSED REDUCTIONA DN PINNING/LEFT;  Surgeon: Dominica SeverinGramig, William, MD;  Location: [facility] OR;  Service: Orthopedics;  Laterality: Left;    History reviewed. No pertinent family history. Social History:  reports that he has been smoking.  He has been smoking about 0.50 packs per day. He has never used smokeless tobacco. He reports that he drinks alcohol. He reports that he does not use drugs.  Allergies: No Known Allergies  Medications Prior to Admission  Medication Sig Dispense Refill  . diclofenac (VOLTAREN) 75 MG EC tablet Take 75 mg by mouth daily as needed for mild pain.    Marland Kitchen. ibuprofen (ADVIL,MOTRIN) 200 MG tablet Take 800 mg by mouth every 6 (six) hours as needed for mild pain.      Results for orders placed or performed during the hospital encounter of 11/20/17 (from the past 48 hour(s))  CBC     Status: Abnormal   Collection Time: 11/20/17 12:13 PM  Result Value Ref Range   WBC 6.8 4.0 - 10.5 K/uL   RBC 5.27 4.22 - 5.81 MIL/uL   Hemoglobin 12.6 (L) 13.0 - 17.0 g/dL   HCT 47.839.7 29.539.0 - 62.152.0 %   MCV 75.3 (L) 78.0 - 100.0 fL   MCH 23.9 (L) 26.0 - 34.0 pg   MCHC 31.7 30.0 - 36.0 g/dL   RDW 30.814.5 65.711.5 - 84.615.5 %   Platelets 132 (L) 150 - 400 K/uL    Comment: Performed at [facility], [address] [place], [place] [id number]   Dg Hand Complete Left  Result Date: 11/19/2017 CLINICAL DATA:  Left hand surgery November 2018.  Surgical pin protruding through the skin. EXAM: LEFT HAND - COMPLETE 3+ VIEW COMPARISON:  07/29/2017 FINDINGS: Two K-wires transfixing the base of the fifth metacarpal. Interval healing of the fifth metacarpal fracture. One of the K-wires protrudes through the skin surface. Partially healed fracture of proximal fourth metacarpal shaft with persistent lucency. No other acute fracture or dislocation. IMPRESSION: 1. Two K-wires transfixing the base of the fifth metacarpal. Interval healing of the fifth metacarpal fracture. One of the K-wires protrudes through the skin surface. 2. Partially healed fracture of proximal fourth metacarpal shaft with persistent lucency. Electronically Signed   By: Elige KoHetal  Patel   On: 11/19/2017 18:37    Review of Systems  Constitutional: Negative for weight loss.  HENT: Negative for ear discharge, ear pain, hearing loss and tinnitus.   Eyes: Negative for blurred vision, double vision, photophobia and pain.  Respiratory: Negative for cough, sputum production and shortness of breath.   Cardiovascular: Negative for chest pain.  Gastrointestinal: Negative for abdominal pain, nausea and vomiting.  Genitourinary: Negative for dysuria, flank pain, frequency and urgency.  Musculoskeletal: Negative for back pain, falls, joint pain, myalgias and neck pain.  Neurological: Negative for dizziness, tingling, sensory change, focal weakness, loss of consciousness and headaches.  Endo/Heme/Allergies: Does  not bruise/bleed easily.  Psychiatric/Behavioral: Negative for depression, memory loss and substance abuse. The patient is not nervous/anxious.     Blood pressure 116/82, pulse (!) 57, temperature 98.3 F (36.8 C), temperature source Oral, resp. rate 20, height 5\' 9"  (1.753 m), weight 74.8 kg (165 lb), SpO2 100 %. Physical Exam  Constitutional: He appears well-developed and well-nourished. No distress.  HENT:  Head: Normocephalic and atraumatic.  Eyes: Conjunctivae are normal. Right  eye exhibits no discharge. Left eye exhibits no discharge. No scleral icterus.  Neck: Normal range of motion.  Cardiovascular: Normal rate and regular rhythm.  Respiratory: Effort normal. No respiratory distress.  Musculoskeletal:  Left shoulder, elbow, wrist, digits- pin protruding 2mm from hypothenar area, nontender, no instability, no blocks to motion  Sens  Ax/R/M/U intact  Mot   Ax/ R/ PIN/ M/ AIN/ U intact  Rad 2+  Neurological: He is alert.  Skin: Skin is warm and dry. He is not diaphoretic.  Psychiatric: He has a normal mood and affect. His behavior is normal.     Assessment/Plan Left hand s/p CRPP with retained hardware -- For removal by Dr. Amanda Pea this afternoon. Please keep NPO. Anticipate discharge home after surgery.    Freeman Caldron, PA-C Orthopedic Surgery [phone] 11/20/2017, 1:21 PM

## 2017-11-20 NOTE — Transfer of Care (Signed)
Immediate Anesthesia Transfer of Care Note  Patient: Dylan SpiesCameron O Faraci  Procedure(s) Performed: IRRIGATION AND DEBRIDEMENT left hand (Left Hand) HARDWARE REMOVAL left hand (Left Hand)  Patient Location: PACU  Anesthesia Type:General  Level of Consciousness: awake and alert   Airway & Oxygen Therapy: Patient Spontanous Breathing  Post-op Assessment: Report given to RN  Post vital signs: Reviewed and stable  Last Vitals:  Vitals Value Taken Time  BP 121/59 11/20/2017  6:34 PM  Temp    Pulse 49 11/20/2017  6:38 PM  Resp 19 11/20/2017  6:38 PM  SpO2 100 % 11/20/2017  6:38 PM  Vitals shown include unvalidated device data.  Last Pain:  Vitals:   11/20/17 1209  TempSrc: Oral      Patients Stated Pain Goal: 4 (11/20/17 1212)  Complications: No apparent anesthesia complications

## 2017-11-20 NOTE — Anesthesia Procedure Notes (Signed)
Procedure Name: LMA Insertion Date/Time: 11/20/2017 6:05 PM Performed by: Gwenyth AllegraAdami, Krystina Strieter, CRNA Pre-anesthesia Checklist: Patient identified, Emergency Drugs available, Patient being monitored, Suction available and Timeout performed Patient Re-evaluated:Patient Re-evaluated prior to induction Oxygen Delivery Method: Circle system utilized Preoxygenation: Pre-oxygenation with 100% oxygen Induction Type: IV induction Ventilation: Mask ventilation without difficulty LMA: LMA inserted LMA Size: 4.0 Number of attempts: 1 Placement Confirmation: positive ETCO2 and breath sounds checked- equal and bilateral Tube secured with: Tape Dental Injury: Teeth and Oropharynx as per pre-operative assessment

## 2017-11-20 NOTE — Op Note (Signed)
See [id number]#[id number] Amanda PeaGramig MD

## 2017-11-20 NOTE — Anesthesia Preprocedure Evaluation (Signed)
Anesthesia Evaluation  Patient identified by MRN, date of birth, ID band Patient awake    Reviewed: Allergy & Precautions, NPO status , Patient's Chart, lab work & pertinent test results  History of Anesthesia Complications Negative for: history of anesthetic complications  Airway Mallampati: I  TM Distance: >3 FB Neck ROM: Full    Dental  (+) Dental Advisory Given   Pulmonary Current Smoker,    breath sounds clear to auscultation       Cardiovascular negative cardio ROS   Rhythm:Regular Rate:Normal     Neuro/Psych negative neurological ROS     GI/Hepatic negative GI ROS, (+)     substance abuse  marijuana use,   Endo/Other  negative endocrine ROS  Renal/GU negative Renal ROS     Musculoskeletal   Abdominal   Peds  Hematology negative hematology ROS (+)   Anesthesia Other Findings   Reproductive/Obstetrics                             Anesthesia Physical  Anesthesia Plan  ASA: II  Anesthesia Plan: General   Post-op Pain Management:    Induction: Intravenous  PONV Risk Score and Plan: 1 and Ondansetron  Airway Management Planned: Oral ETT  Additional Equipment:   Intra-op Plan:   Post-operative Plan: Extubation in OR  Informed Consent: I have reviewed the patients History and Physical, chart, labs and discussed the procedure including the risks, benefits and alternatives for the proposed anesthesia with the patient or authorized representative who has indicated his/her understanding and acceptance.   Dental advisory given  Plan Discussed with: CRNA  Anesthesia Plan Comments:         Anesthesia Quick Evaluation

## 2017-11-21 ENCOUNTER — Encounter (HOSPITAL_COMMUNITY): Payer: Self-pay | Admitting: Orthopedic Surgery

## 2017-11-21 NOTE — Op Note (Signed)
NAMWilmer Floor:  Hennon, Briston               ACCOUNT NO.:  [id number]  MEDICAL RECORD NO.:  [id number]  LOCATION:                                 FACILITY:  PHYSICIAN:  Dionne AnoWilliam M. Amanda PeaGramig, M.D.     DATE OF BIRTH:  DATE OF PROCEDURE:  11/20/2017 DATE OF DISCHARGE:                              OPERATIVE REPORT   PREOPERATIVE DIAGNOSIS:  Deep hardware of left hand.  POSTOPERATIVE DIAGNOSIS:  Deep hardware of left hand.  PROCEDURES PERFORMED: 1. Irrigation and debridement of skin and subcutaneous tissue in left     hand. 2. Deep hardware removal x2 of K-wires in left hand. 3. AP and lateral and oblique x-rays were performed, examined, and     interpreted by myself of left hand.  SURGEON:  Dionne AnoWilliam M. Amanda PeaGramig, MD.  ASSISTANTS:  None.  COMPLICATIONS:  None.  ANESTHESIA:  General.  TOURNIQUET TIME:  Less than 15 minutes.  INDICATIONS FOR PROCEDURE:  This is a 22 year old male with poor followup.  He had surgery in November and presented for followup in March.  The patient was booked for surgery for deep hardware removal. Fortunately and somewhat miraculously, he has wonderful hand function.  He has 1 pin protruding.  We are planning superficial and deep pin removal with I and D.  OPERATION IN DETAIL:  The patient was seen by myself and Anesthesia, taken to the operative theater, and underwent smooth induction of general anesthetic.  He was prepped with Hibiclens, followed by Betadine scrub.  Outline marks were made visually.  Tourniquet was insufflated and a sterile field was secured with time-out being observed.  An incision was made, 1 cm in nature.  Both pins were removed.  I performed I and D of skin and subcutaneous tissue, and performed deep hardware removal without difficulty.  I then demonstrated full resolution of his fractures and great stability under live fluoro.  AP, lateral, and oblique x-rays were performed, examined and interpreted by myself, and looked to be  excellent.  I was pleased with this.  I irrigated copiously with greater than a liter of saline, and closed the wound with interrupted chromic.  I called his mother, Archie Pattenonya, at [phone] and discussed with her all issues, instructions, and plans, to take over-the-counter pain medicine, and see me back in 10 to 14 days and notify if same problems occur.  It has been a pleasure to see him today.     Dionne AnoWilliam M. Amanda PeaGramig, M.D.   ______________________________ Dionne AnoWilliam M. Amanda PeaGramig, M.D.    [facility]/MED[facility]  D:  11/20/2017  T:  11/21/2017  Job:  [id number]

## 2017-11-23 NOTE — Anesthesia Postprocedure Evaluation (Signed)
Anesthesia Post Note  Patient: Dylan Dominguez  Procedure(s) Performed: IRRIGATION AND DEBRIDEMENT left hand (Left Hand) HARDWARE REMOVAL left hand (Left Hand)     Patient location during evaluation: PACU Anesthesia Type: General Level of consciousness: sedated and patient cooperative Pain management: pain level controlled Vital Signs Assessment: post-procedure vital signs reviewed and stable Respiratory status: spontaneous breathing Cardiovascular status: stable Anesthetic complications: no    Last Vitals:  Vitals:   11/20/17 1928 11/20/17 1933  BP:  111/64  Pulse: (!) 51 (!) 52  Resp: 15 16  Temp:    SpO2: 99% 100%    Last Pain:  Vitals:   11/20/17 1833  TempSrc:   PainSc: 0-No pain   Pain Goal: Patients Stated Pain Goal: 4 (11/20/17 1212)               Lewie LoronJohn Getsemani Lindon

## 2018-10-21 IMAGING — DX DG HAND COMPLETE 3+V*L*
3 series · 3 of 3 positions shown · non-contrast
Comparison: 07/29/2017

CLINICAL DATA: Left hand surgery July 2017. Surgical pin
protruding through the skin.

EXAM:
LEFT HAND - COMPLETE 3+ VIEW

[hand pa]
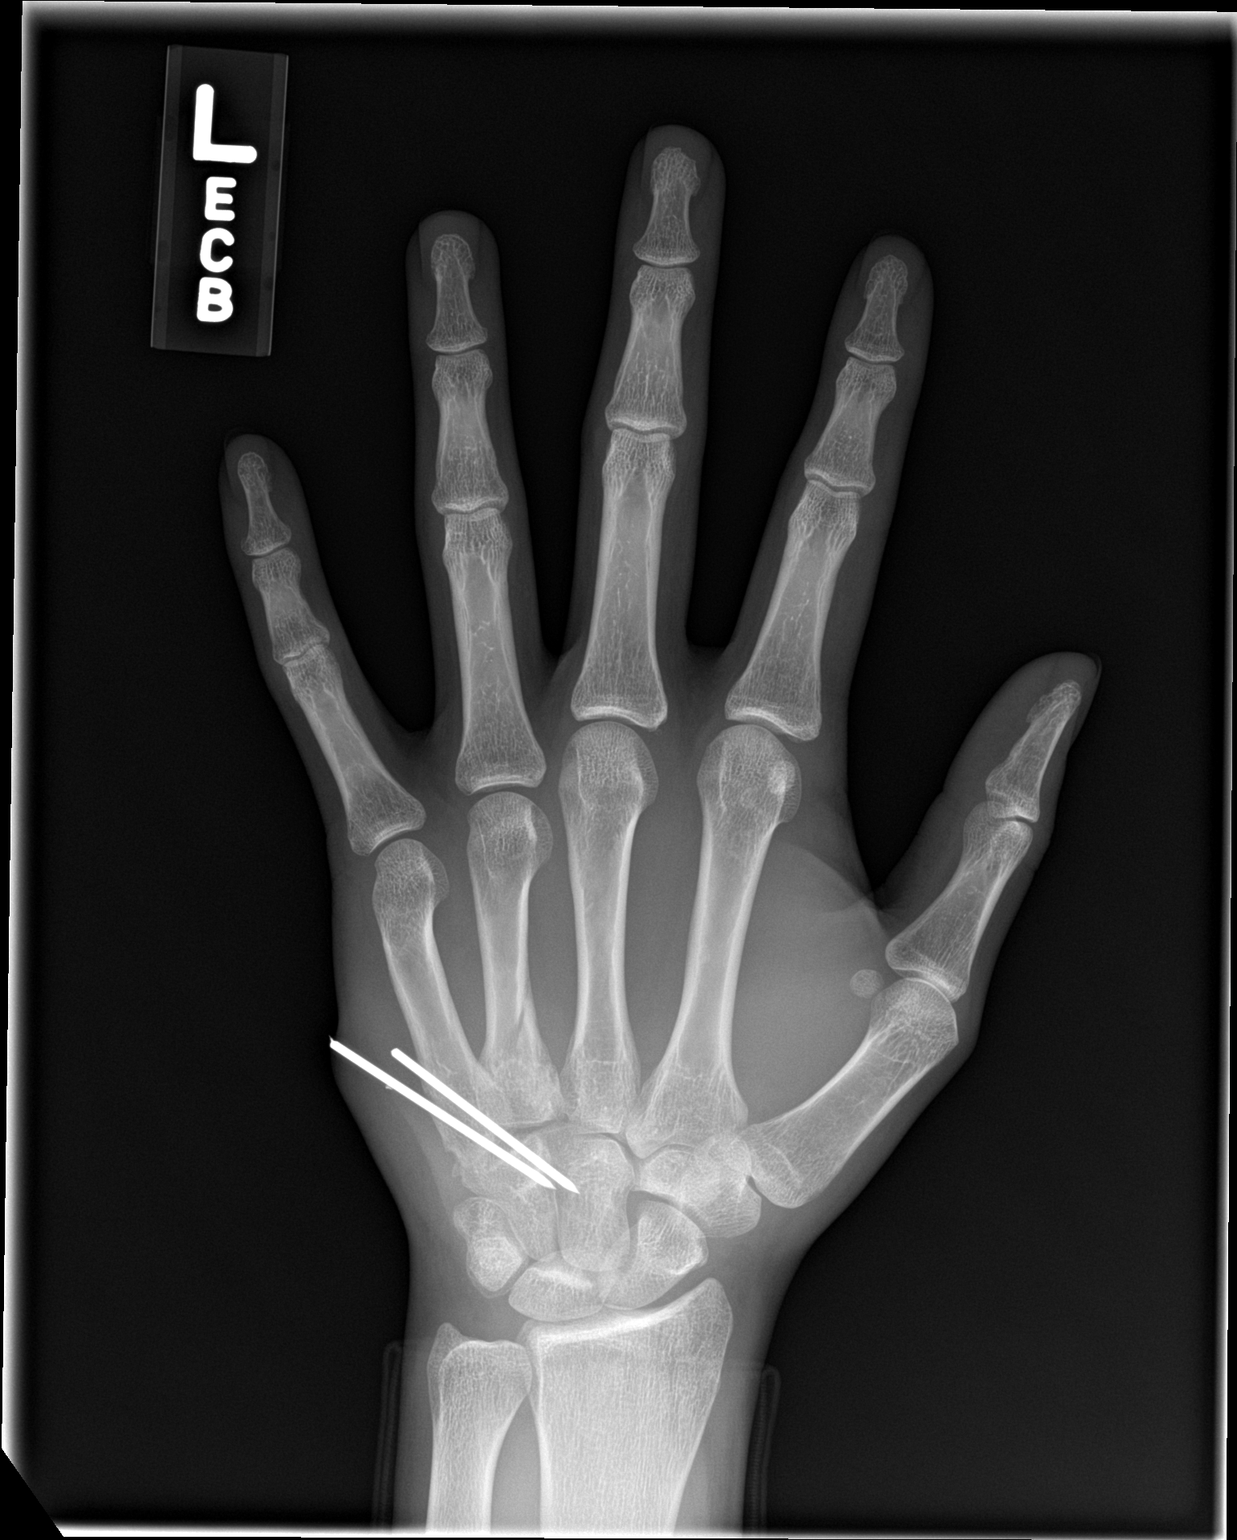

[hand obl]
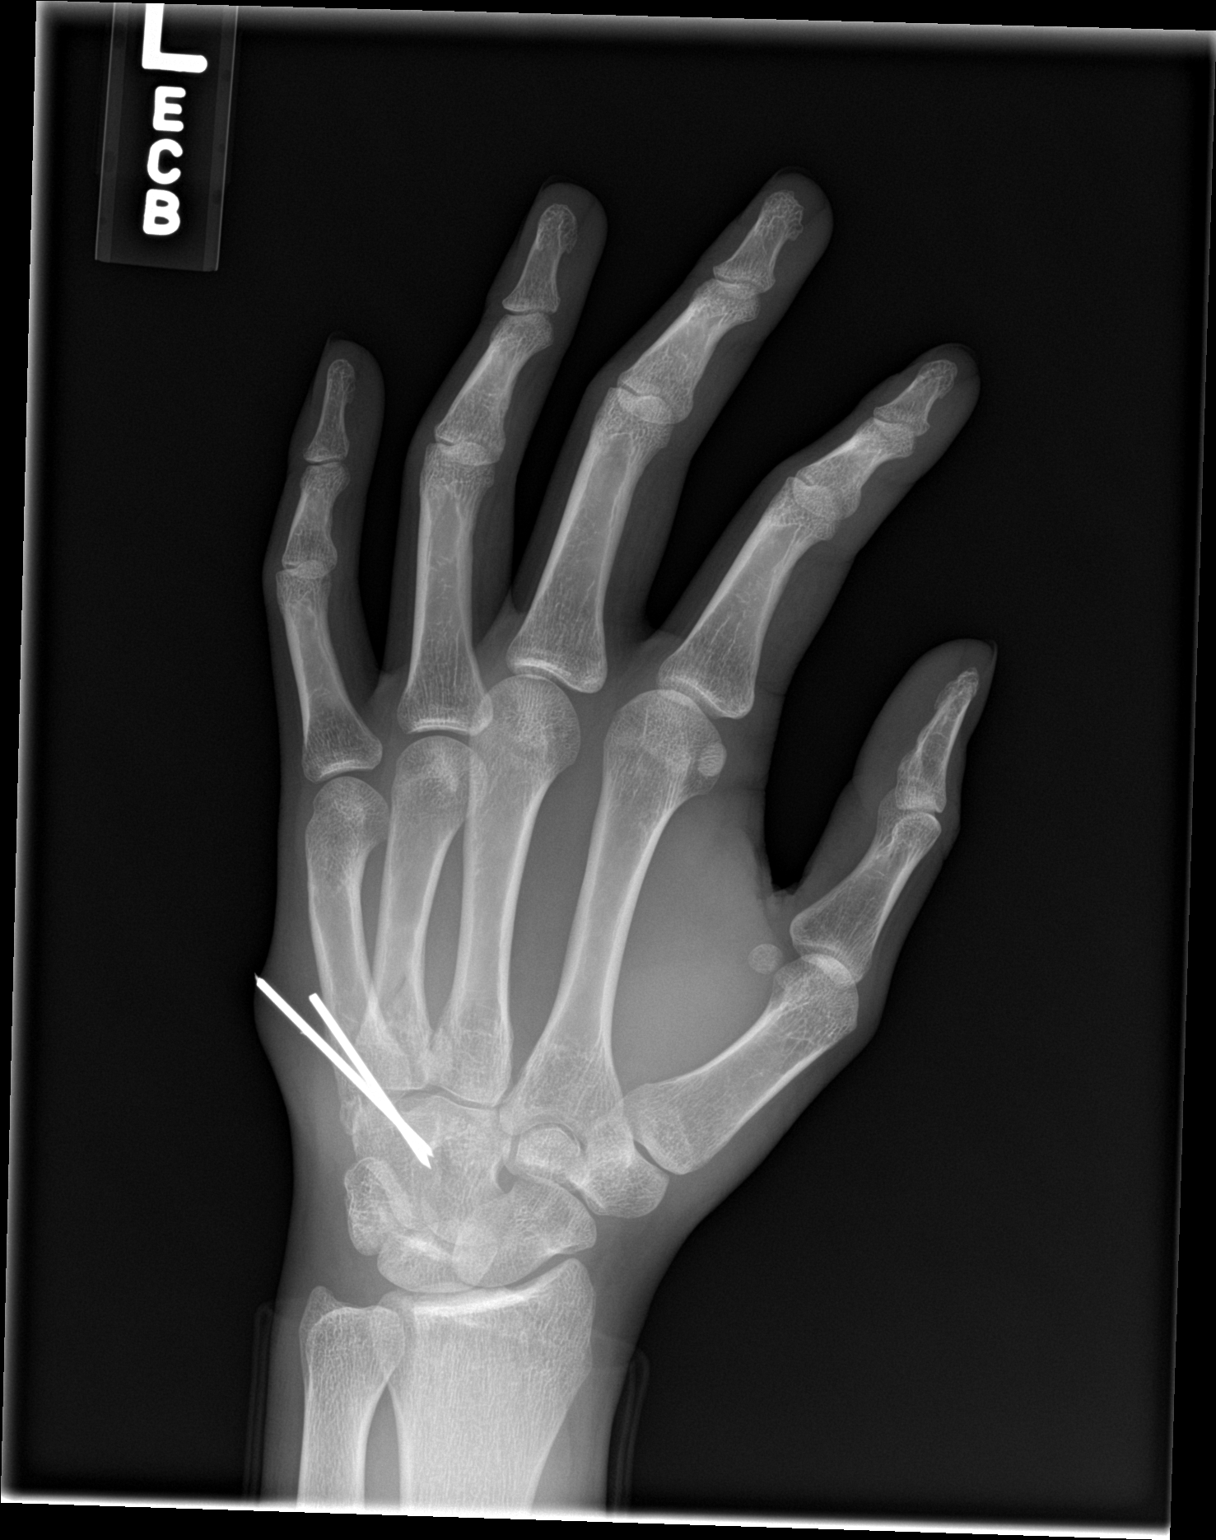

[hand lat]
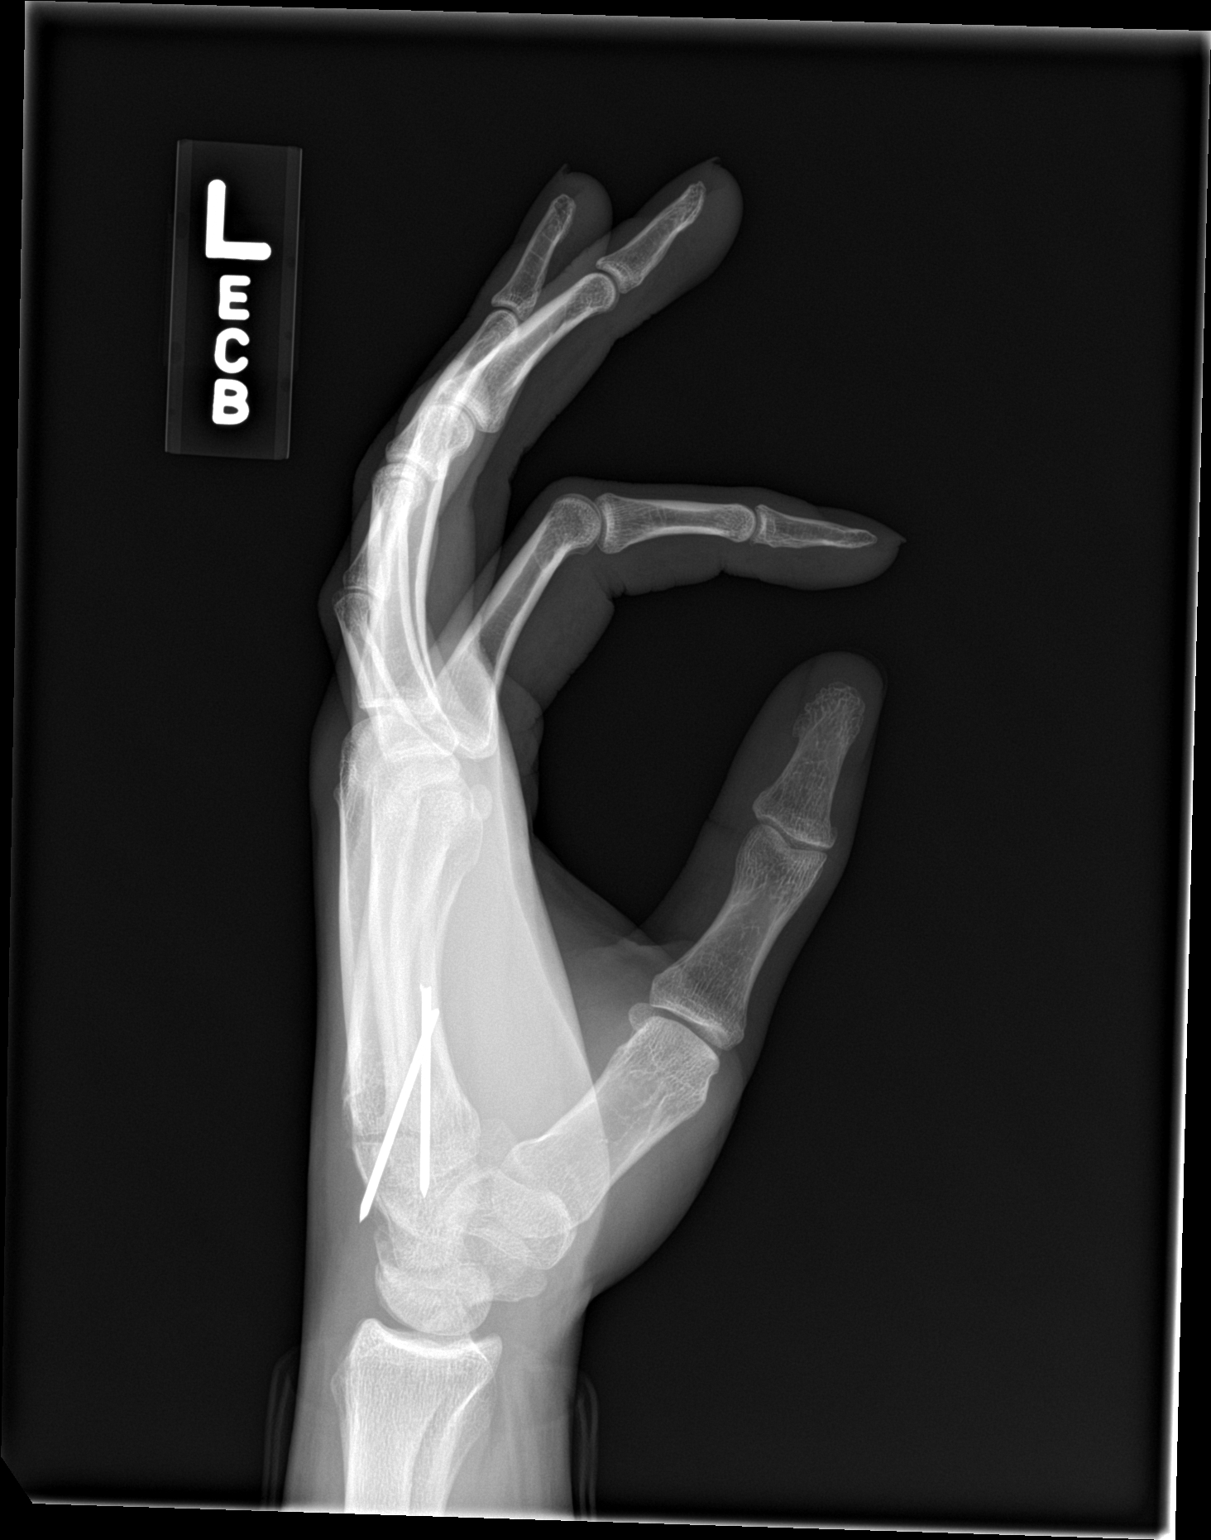

[3 of 3 positions shown; findings below may reference images not displayed]

FINDINGS: Two K-wires transfixing the base of the fifth metacarpal. Interval
healing of the fifth metacarpal fracture. One of the K-wires
protrudes through the skin surface.

Partially healed fracture of proximal fourth metacarpal shaft with
persistent lucency.

No other acute fracture or dislocation.
IMPRESSION: 1. Two K-wires transfixing the base of the fifth metacarpal.
Interval healing of the fifth metacarpal fracture. One of the
K-wires protrudes through the skin surface.
2. Partially healed fracture of proximal fourth metacarpal shaft
with persistent lucency.

## 2020-01-25 ENCOUNTER — Ambulatory Visit (HOSPITAL_COMMUNITY)
Admission: EM | Admit: 2020-01-25 | Discharge: 2020-01-25 | Disposition: A | Discharge status: Home / Self Care | Payer: Self-pay | Attending: Physician Assistant | Admitting: Physician Assistant

## 2020-01-25 ENCOUNTER — Encounter (HOSPITAL_COMMUNITY): Payer: Self-pay

## 2020-01-25 ENCOUNTER — Other Ambulatory Visit: Payer: Self-pay

## 2020-01-25 DIAGNOSIS — N342 Other urethritis: Secondary | ICD-10-CM | POA: Insufficient documentation

## 2020-01-25 DIAGNOSIS — N2889 Other specified disorders of kidney and ureter: Secondary | ICD-10-CM

## 2020-01-25 DIAGNOSIS — B081 Molluscum contagiosum: Secondary | ICD-10-CM | POA: Insufficient documentation

## 2020-01-25 LAB — HIV ANTIBODY (ROUTINE TESTING W REFLEX): HIV Screen 4th Generation wRfx: NONREACTIVE

## 2020-01-25 MED ORDER — CEFTRIAXONE SODIUM 500 MG IJ SOLR
500.0000 mg | Freq: Once | INTRAMUSCULAR | Status: AC
  Administered 2020-01-25: 500 mg via INTRAMUSCULAR

## 2020-01-25 MED ORDER — CEFTRIAXONE SODIUM 500 MG IJ SOLR
INTRAMUSCULAR | Status: AC
  Filled 2020-01-25: qty 500

## 2020-01-25 MED ORDER — DOXYCYCLINE HYCLATE 100 MG PO CAPS: 100.0000 mg | ORAL_CAPSULE | Freq: Two times a day (BID) | ORAL | 0 refills | Status: AC

## 2020-01-25 NOTE — Discharge Instructions (Signed)
We have sent your labs, and will notify you of necessary changes to your treatments  Take the doxycyline 2 times a day for 7 days  With regard to your skin rash you should be seen by [facility]ologist [facility] with [facility] or in the community.  [facility] call the [facility] to expedite your primary care appointment or request a dermatology appointment.  I will also give you a dermatology option in the community.  Abstain from sex until you have been further evaluated for your skin rash.  Complete all treatments.

## 2020-01-25 NOTE — ED Triage Notes (Signed)
Pt presents for full STD testing after having penile discharge and irritation.

## 2020-01-25 NOTE — ED Provider Notes (Signed)
[place]    CSN: [id number] Arrival date & time: 01/25/20  1003      History   Chief Complaint Chief Complaint  Patient presents with  . STD Testing    HPI Dylan Dominguez is a 24 y.o. male.   Patient presents urgent care for STD testing.  Reports he has had penile irritation and discharge for some time.  He has been waiting to get into the [place] for evaluation however he decided come in today for this.  He also reports bumps on his penis that of been present for a long time.  They are not painful or itchy.  He reports he has been sexually active and is concerned about STDs.  He denies testicular or groin pain.  Denies abdominal pain.     Past Medical History:  Diagnosis Date  . Left wrist fracture     There are no problems to display for this patient.   Past Surgical History:  Procedure Laterality Date  . arm fracture Right   . HARDWARE REMOVAL Left 11/20/2017   Procedure: HARDWARE REMOVAL left hand;  Surgeon: Roseanne Kaufman, MD;  Location: [facility];  Service: Orthopedics;  Laterality: Left;  . I & D EXTREMITY Left 11/20/2017   Procedure: IRRIGATION AND DEBRIDEMENT left hand;  Surgeon: Roseanne Kaufman, MD;  Location: [facility];  Service: Orthopedics;  Laterality: Left;  . PERCUTANEOUS PINNING Left 07/29/2017   Procedure: CLOSED REDUCTIONA DN PINNING/LEFT;  Surgeon: Roseanne Kaufman, MD;  Location: [facility];  Service: Orthopedics;  Laterality: Left;       Home Medications    Prior to Admission medications   Medication Sig Start Date End Date Taking? Authorizing Provider  diclofenac (VOLTAREN) 75 MG EC tablet Take 75 mg by mouth daily as needed for mild pain.    [provider]  doxycycline (VIBRAMYCIN) 100 MG capsule Take 1 capsule (100 mg total) by mouth 2 (two) times daily for 7 days. 01/25/20 02/01/20  Ernest Popowski, Marguerita Beards, PA-C  ibuprofen (ADVIL,MOTRIN) 200 MG tablet Take 800 mg by mouth every 6 (six) hours as needed for mild pain.    [provider]     Family History Family History  Family history unknown: Yes    Social History Social History   Tobacco Use  . Smoking status: Current Some Day Smoker    Packs/day: 0.50  . Smokeless tobacco: Never Used  Substance Use Topics  . Alcohol use: Yes    Comment: occasional  . Drug use: No     Allergies   Patient has no known allergies.   Review of Systems Review of Systems   Physical Exam Triage Vital Signs ED Triage Vitals  Enc Vitals Group     BP 01/25/20 1040 115/63     Pulse Rate 01/25/20 1040 70     Resp 01/25/20 1040 16     Temp 01/25/20 1040 98.2 F (36.8 C)     Temp Source 01/25/20 1040 Oral     SpO2 01/25/20 1040 100 %     Weight --      Height --      Head Circumference --      Peak Flow --      Pain Score 01/25/20 1043 3     Pain Loc --      Pain Edu? --      Excl. in [place]? --    No data found.  Updated Vital Signs BP 115/63 (BP Location: Left Arm)  Pulse 70   Temp 98.2 F (36.8 C) (Oral)   Resp 16   SpO2 100%   Visual Acuity Right Eye Distance:   Left Eye Distance:   Bilateral Distance:    Right Eye Near:   Left Eye Near:    Bilateral Near:     Physical Exam Vitals and nursing note reviewed.  Constitutional:      Appearance: Normal appearance. He is well-developed.  HENT:     Head: Normocephalic and atraumatic.  Eyes:     Conjunctiva/sclera: Conjunctivae normal.  Cardiovascular:     Rate and Rhythm: Normal rate.  Pulmonary:     Effort: Pulmonary effort is normal. No respiratory distress.  Abdominal:     Palpations: Abdomen is soft.     Tenderness: There is no abdominal tenderness.  Genitourinary:    Comments: There are several small papules that have appearance of umbilicated center on the shaft and glans of the penis..  No evidence of a vesicles.  No ulcerations.  No testicular mass or tenderness appreciated.  Swab obtained from the urethra.  Scant clear discharge present  No inguinal lymphadenopathy Musculoskeletal:      Cervical back: Neck supple.  Skin:    General: Skin is warm and dry.  Neurological:     Mental Status: He is alert.      UC Treatments / Results  Labs (all labs ordered are listed, but only abnormal results are displayed) Labs Reviewed  HIV ANTIBODY (ROUTINE TESTING W REFLEX)  RPR  CYTOLOGY, (ORAL, ANAL, URETHRAL) ANCILLARY ONLY    EKG   Radiology No results found.  Procedures Procedures (including critical care time)  Medications Ordered in UC Medications  cefTRIAXone (ROCEPHIN) injection 500 mg (500 mg Intramuscular Given 01/25/20 1144)    Initial Impression / Assessment and Plan / UC Course  I have reviewed the triage vital signs and the nursing notes.  Pertinent labs & imaging results that were available during my care of the patient were reviewed by me and considered in my medical decision making (see chart for details).     #Urethritis #Molluscum contagiosum Patient is a 24 year old male presenting with urethritis and rash most consistent with molluscum contagiosum.  No vesicular lesions and has been present for some time and nonpainful so doubt herpetic.  Doubt HPV.  Swab was obtained from urethra.  Will test for gonorrhea, chlamydia and trichomonas.  Rocephin in clinic and doxycycline sent.  In[facility]ed [facility] and to see dermatologist for confirmation of skin rash.  Also gave community option for dermatology.  Strict abstain from sex until has been evaluated and has a clear understanding of lesions on his penis.  They verbalized understand this plan. -HIV and syphilis testing also sent Final Clinical Impressions(s) / UC Diagnoses   Final diagnoses:  Molluscum contagiosum  Urethritis     Discharge Instructions     We have sent your labs, and will notify you of necessary changes to your treatments  Take the doxycyline 2 times a day for 7 days  With regard to your skin rash you should be seen by  dermatologist either with the [facility] or in the community.  Please call the [facility] to expedite your primary care appointment or request a dermatology appointment.  I will also give you a dermatology option in the community.  Abstain from sex until you have been further evaluated for your skin rash.  Complete all treatments.      ED  Prescriptions    Medication Sig Dispense Auth. Provider   doxycycline (VIBRAMYCIN) 100 MG capsule Take 1 capsule (100 mg total) by mouth 2 (two) times daily for 7 days. 14 capsule Nathanyel Defenbaugh, Veryl Speak, PA-C     PDMP not reviewed this encounter.   Hermelinda Medicus, PA-C 01/25/20 2047

## 2020-01-26 LAB — CYTOLOGY, (ORAL, ANAL, URETHRAL) ANCILLARY ONLY
Chlamydia: NEGATIVE
Comment: NEGATIVE
Comment: NEGATIVE
Comment: NORMAL
Neisseria Gonorrhea: NEGATIVE
Trichomonas: NEGATIVE

## 2020-01-26 LAB — RPR: RPR Ser Ql: NONREACTIVE

## 2024-05-01 ENCOUNTER — Encounter (HOSPITAL_COMMUNITY): Payer: Self-pay

## 2024-05-01 ENCOUNTER — Emergency Department (HOSPITAL_COMMUNITY)

## 2024-05-01 ENCOUNTER — Other Ambulatory Visit: Payer: Self-pay

## 2024-05-01 ENCOUNTER — Inpatient Hospital Stay (HOSPITAL_COMMUNITY)
Admission: EM | Admit: 2024-05-01 | Discharge: 2024-05-05 | DRG: 516 | Disposition: A | Discharge status: Home / Self Care | Attending: General Surgery | Admitting: General Surgery

## 2024-05-01 DIAGNOSIS — F430 Acute stress reaction: Secondary | ICD-10-CM | POA: Diagnosis not present

## 2024-05-01 DIAGNOSIS — S32029A Unspecified fracture of second lumbar vertebra, initial encounter for closed fracture: Secondary | ICD-10-CM | POA: Diagnosis not present

## 2024-05-01 DIAGNOSIS — S3210XA Unspecified fracture of sacrum, initial encounter for closed fracture: Secondary | ICD-10-CM | POA: Diagnosis not present

## 2024-05-01 DIAGNOSIS — S22079A Unspecified fracture of T9-T10 vertebra, initial encounter for closed fracture: Secondary | ICD-10-CM | POA: Diagnosis present

## 2024-05-01 DIAGNOSIS — M898X9 Other specified disorders of bone, unspecified site: Secondary | ICD-10-CM | POA: Diagnosis not present

## 2024-05-01 DIAGNOSIS — E876 Hypokalemia: Secondary | ICD-10-CM | POA: Diagnosis present

## 2024-05-01 DIAGNOSIS — E872 Acidosis, unspecified: Secondary | ICD-10-CM | POA: Diagnosis not present

## 2024-05-01 DIAGNOSIS — Y9355 Activity, bike riding: Secondary | ICD-10-CM

## 2024-05-01 DIAGNOSIS — D696 Thrombocytopenia, unspecified: Secondary | ICD-10-CM | POA: Diagnosis not present

## 2024-05-01 DIAGNOSIS — M25551 Pain in right hip: Secondary | ICD-10-CM | POA: Diagnosis not present

## 2024-05-01 DIAGNOSIS — S0990XA Unspecified injury of head, initial encounter: Secondary | ICD-10-CM | POA: Diagnosis not present

## 2024-05-01 DIAGNOSIS — R911 Solitary pulmonary nodule: Secondary | ICD-10-CM | POA: Diagnosis not present

## 2024-05-01 DIAGNOSIS — Y92410 Unspecified street and highway as the place of occurrence of the external cause: Secondary | ICD-10-CM

## 2024-05-01 DIAGNOSIS — E559 Vitamin D deficiency, unspecified: Secondary | ICD-10-CM | POA: Diagnosis present

## 2024-05-01 DIAGNOSIS — S32591A Other specified fracture of right pubis, initial encounter for closed fracture: Secondary | ICD-10-CM | POA: Diagnosis not present

## 2024-05-01 DIAGNOSIS — D62 Acute posthemorrhagic anemia: Secondary | ICD-10-CM | POA: Diagnosis not present

## 2024-05-01 DIAGNOSIS — Y902 Blood alcohol level of 40-59 mg/100 ml: Secondary | ICD-10-CM | POA: Diagnosis not present

## 2024-05-01 DIAGNOSIS — S79911A Unspecified injury of right hip, initial encounter: Secondary | ICD-10-CM | POA: Diagnosis not present

## 2024-05-01 DIAGNOSIS — S32048A Other fracture of fourth lumbar vertebra, initial encounter for closed fracture: Secondary | ICD-10-CM | POA: Diagnosis not present

## 2024-05-01 DIAGNOSIS — R319 Hematuria, unspecified: Secondary | ICD-10-CM | POA: Diagnosis present

## 2024-05-01 DIAGNOSIS — R9389 Abnormal findings on diagnostic imaging of other specified body structures: Secondary | ICD-10-CM | POA: Diagnosis not present

## 2024-05-01 DIAGNOSIS — M419 Scoliosis, unspecified: Secondary | ICD-10-CM | POA: Diagnosis not present

## 2024-05-01 DIAGNOSIS — F1010 Alcohol abuse, uncomplicated: Secondary | ICD-10-CM | POA: Diagnosis not present

## 2024-05-01 DIAGNOSIS — M4314 Spondylolisthesis, thoracic region: Secondary | ICD-10-CM | POA: Diagnosis not present

## 2024-05-01 DIAGNOSIS — S3993XA Unspecified injury of pelvis, initial encounter: Secondary | ICD-10-CM | POA: Diagnosis not present

## 2024-05-01 DIAGNOSIS — Z91199 Patient's noncompliance with other medical treatment and regimen due to unspecified reason: Secondary | ICD-10-CM

## 2024-05-01 DIAGNOSIS — S32019A Unspecified fracture of first lumbar vertebra, initial encounter for closed fracture: Secondary | ICD-10-CM | POA: Diagnosis not present

## 2024-05-01 DIAGNOSIS — E8721 Acute metabolic acidosis: Secondary | ICD-10-CM | POA: Diagnosis not present

## 2024-05-01 DIAGNOSIS — S32592A Other specified fracture of left pubis, initial encounter for closed fracture: Principal | ICD-10-CM | POA: Diagnosis present

## 2024-05-01 DIAGNOSIS — F10129 Alcohol abuse with intoxication, unspecified: Secondary | ICD-10-CM | POA: Diagnosis not present

## 2024-05-01 DIAGNOSIS — F17210 Nicotine dependence, cigarettes, uncomplicated: Secondary | ICD-10-CM | POA: Diagnosis not present

## 2024-05-01 DIAGNOSIS — S32811A Multiple fractures of pelvis with unstable disruption of pelvic ring, initial encounter for closed fracture: Secondary | ICD-10-CM | POA: Diagnosis not present

## 2024-05-01 DIAGNOSIS — S329XXA Fracture of unspecified parts of lumbosacral spine and pelvis, initial encounter for closed fracture: Principal | ICD-10-CM

## 2024-05-01 DIAGNOSIS — S32049A Unspecified fracture of fourth lumbar vertebra, initial encounter for closed fracture: Secondary | ICD-10-CM | POA: Diagnosis present

## 2024-05-01 DIAGNOSIS — M545 Low back pain, unspecified: Secondary | ICD-10-CM | POA: Diagnosis not present

## 2024-05-01 DIAGNOSIS — F4310 Post-traumatic stress disorder, unspecified: Secondary | ICD-10-CM | POA: Diagnosis present

## 2024-05-01 DIAGNOSIS — S32039A Unspecified fracture of third lumbar vertebra, initial encounter for closed fracture: Secondary | ICD-10-CM | POA: Diagnosis present

## 2024-05-01 DIAGNOSIS — F419 Anxiety disorder, unspecified: Secondary | ICD-10-CM | POA: Diagnosis not present

## 2024-05-01 DIAGNOSIS — R918 Other nonspecific abnormal finding of lung field: Secondary | ICD-10-CM | POA: Diagnosis not present

## 2024-05-01 DIAGNOSIS — S32018A Other fracture of first lumbar vertebra, initial encounter for closed fracture: Secondary | ICD-10-CM | POA: Diagnosis not present

## 2024-05-01 DIAGNOSIS — R739 Hyperglycemia, unspecified: Secondary | ICD-10-CM | POA: Diagnosis not present

## 2024-05-01 DIAGNOSIS — S299XXA Unspecified injury of thorax, initial encounter: Secondary | ICD-10-CM | POA: Diagnosis not present

## 2024-05-01 DIAGNOSIS — S3992XA Unspecified injury of lower back, initial encounter: Secondary | ICD-10-CM | POA: Diagnosis not present

## 2024-05-01 DIAGNOSIS — S199XXA Unspecified injury of neck, initial encounter: Secondary | ICD-10-CM | POA: Diagnosis not present

## 2024-05-01 DIAGNOSIS — J90 Pleural effusion, not elsewhere classified: Secondary | ICD-10-CM | POA: Diagnosis not present

## 2024-05-01 LAB — ETHANOL: Alcohol, Ethyl (B): 54 mg/dL — ABNORMAL HIGH (ref <15)

## 2024-05-01 LAB — I-STAT CHEM 8, ED
BUN: 10 mg/dL (ref 6–20)
Calcium, Ion: 1.11 mmol/L — ABNORMAL LOW (ref 1.15–1.40)
Chloride: 102 mmol/L (ref 98–111)
Creatinine, Ser: 1.3 mg/dL — ABNORMAL HIGH (ref 0.61–1.24)
Glucose, Bld: 122 mg/dL — ABNORMAL HIGH (ref 70–99)
HCT: 46 % (ref 39.0–52.0)
Hemoglobin: 15.6 g/dL (ref 13.0–17.0)
Potassium: 3.1 mmol/L — ABNORMAL LOW (ref 3.5–5.1)
Sodium: 140 mmol/L (ref 135–145)
TCO2: 23 mmol/L (ref 22–32)

## 2024-05-01 LAB — URINALYSIS, ROUTINE W REFLEX MICROSCOPIC
Bacteria, UA: NONE SEEN
Bilirubin Urine: NEGATIVE
Glucose, UA: NEGATIVE mg/dL
Ketones, ur: 20 mg/dL — AB
Leukocytes,Ua: NEGATIVE
Nitrite: NEGATIVE
Protein, ur: 30 mg/dL — AB
RBC / HPF: >50 RBC/hpf (ref 0–5)
Specific Gravity, Urine: >1.046 — ABNORMAL HIGH (ref 1.005–1.030)
pH: 5 (ref 5.0–8.0)

## 2024-05-01 LAB — COMPREHENSIVE METABOLIC PANEL WITH GFR
ALT: 22 U/L (ref 0–44)
AST: 33 U/L (ref 15–41)
Albumin: 4.4 g/dL (ref 3.5–5.0)
Alkaline Phosphatase: 38 U/L (ref 38–126)
Anion gap: 14 (ref 5–15)
BUN: 10 mg/dL (ref 6–20)
CO2: 21 mmol/L — ABNORMAL LOW (ref 22–32)
Calcium: 9.2 mg/dL (ref 8.9–10.3)
Chloride: 103 mmol/L (ref 98–111)
Creatinine, Ser: 1.22 mg/dL (ref 0.61–1.24)
GFR, Estimated: >60 mL/min (ref >60)
Glucose, Bld: 123 mg/dL — ABNORMAL HIGH (ref 70–99)
Potassium: 3 mmol/L — ABNORMAL LOW (ref 3.5–5.1)
Sodium: 138 mmol/L (ref 135–145)
Total Bilirubin: 0.6 mg/dL (ref 0.0–1.2)
Total Protein: 7.3 g/dL (ref 6.5–8.1)

## 2024-05-01 LAB — CBC
HCT: 45 % (ref 39.0–52.0)
Hemoglobin: 13.4 g/dL (ref 13.0–17.0)
MCH: 24.3 pg — ABNORMAL LOW (ref 26.0–34.0)
MCHC: 29.8 g/dL — ABNORMAL LOW (ref 30.0–36.0)
MCV: 81.5 fL (ref 80.0–100.0)
Platelets: 191 K/uL (ref 150–400)
RBC: 5.52 MIL/uL (ref 4.22–5.81)
RDW: 15.6 % — ABNORMAL HIGH (ref 11.5–15.5)
WBC: 11.5 K/uL — ABNORMAL HIGH (ref 4.0–10.5)
nRBC: 0 % (ref 0.0–0.2)

## 2024-05-01 LAB — PROTIME-INR
INR: 1 (ref 0.8–1.2)
Prothrombin Time: 14 s (ref 11.4–15.2)

## 2024-05-01 LAB — I-STAT CG4 LACTIC ACID, ED: Lactic Acid, Venous: 3.5 mmol/L (ref 0.5–1.9)

## 2024-05-01 LAB — HIV ANTIBODY (ROUTINE TESTING W REFLEX): HIV Screen 4th Generation wRfx: NONREACTIVE

## 2024-05-01 LAB — SAMPLE TO BLOOD BANK

## 2024-05-01 LAB — MRSA NEXT GEN BY PCR, NASAL: MRSA by PCR Next Gen: NOT DETECTED

## 2024-05-01 MED ORDER — IOHEXOL 350 MG/ML SOLN
75.0000 mL | Freq: Once | INTRAVENOUS | Status: AC | PRN
  Administered 2024-05-01: 75 mL via INTRAVENOUS

## 2024-05-01 MED ORDER — ONDANSETRON HCL 4 MG/2ML IJ SOLN
4.0000 mg | Freq: Four times a day (QID) | INTRAMUSCULAR | Status: DC | PRN
  Administered 2024-05-01: 4 mg via INTRAVENOUS
  Filled 2024-05-01: qty 2

## 2024-05-01 MED ORDER — HYDROMORPHONE HCL 1 MG/ML IJ SOLN
0.5000 mg | INTRAMUSCULAR | Status: DC | PRN
  Administered 2024-05-01 – 2024-05-04 (×7): 1 mg via INTRAVENOUS
  Filled 2024-05-01 (×8): qty 1

## 2024-05-01 MED ORDER — METHOCARBAMOL 500 MG PO TABS
500.0000 mg | ORAL_TABLET | Freq: Three times a day (TID) | ORAL | Status: DC
  Administered 2024-05-02 – 2024-05-03 (×4): 500 mg via ORAL
  Filled 2024-05-01 (×4): qty 1

## 2024-05-01 MED ORDER — CHLORHEXIDINE GLUCONATE CLOTH 2 % EX PADS
6.0000 | MEDICATED_PAD | Freq: Every day | CUTANEOUS | Status: DC
  Administered 2024-05-01 – 2024-05-03 (×3): 6 via TOPICAL

## 2024-05-01 MED ORDER — HYDRALAZINE HCL 20 MG/ML IJ SOLN: 10.0000 mg | INTRAMUSCULAR | Status: DC | PRN

## 2024-05-01 MED ORDER — ONDANSETRON HCL 4 MG/2ML IJ SOLN: 4.0000 mg | Freq: Once | INTRAMUSCULAR | Status: DC

## 2024-05-01 MED ORDER — SODIUM CHLORIDE 0.9 % IV SOLN
12.5000 mg | Freq: Four times a day (QID) | INTRAVENOUS | Status: DC | PRN
  Administered 2024-05-02: 12.5 mg via INTRAVENOUS
  Filled 2024-05-01: qty 12.5

## 2024-05-01 MED ORDER — CEFAZOLIN SODIUM-DEXTROSE 2-4 GM/100ML-% IV SOLN
2.0000 g | INTRAVENOUS | Status: AC
  Administered 2024-05-02: 2 g via INTRAVENOUS
  Filled 2024-05-01: qty 100

## 2024-05-01 MED ORDER — METOPROLOL TARTRATE 5 MG/5ML IV SOLN: 5.0000 mg | Freq: Four times a day (QID) | INTRAVENOUS | Status: DC | PRN

## 2024-05-01 MED ORDER — ORAL CARE MOUTH RINSE: 15.0000 mL | OROMUCOSAL | Status: DC | PRN

## 2024-05-01 MED ORDER — KCL IN DEXTROSE-NACL 20-5-0.9 MEQ/L-%-% IV SOLN
INTRAVENOUS | Status: AC
  Filled 2024-05-01 (×2): qty 1000

## 2024-05-01 MED ORDER — GABAPENTIN 300 MG PO CAPS
300.0000 mg | ORAL_CAPSULE | Freq: Three times a day (TID) | ORAL | Status: DC
  Administered 2024-05-01 – 2024-05-05 (×9): 300 mg via ORAL
  Filled 2024-05-01 (×8): qty 1

## 2024-05-01 MED ORDER — METHOCARBAMOL 1000 MG/10ML IJ SOLN
500.0000 mg | Freq: Three times a day (TID) | INTRAMUSCULAR | Status: DC
  Administered 2024-05-01 – 2024-05-04 (×3): 500 mg via INTRAVENOUS
  Filled 2024-05-01 (×6): qty 10

## 2024-05-01 MED ORDER — FENTANYL CITRATE PF 50 MCG/ML IJ SOSY
100.0000 ug | PREFILLED_SYRINGE | Freq: Once | INTRAMUSCULAR | Status: AC
  Administered 2024-05-01: 100 ug via INTRAVENOUS
  Filled 2024-05-01: qty 2

## 2024-05-01 MED ORDER — POTASSIUM CHLORIDE 10 MEQ/100ML IV SOLN
10.0000 meq | Freq: Once | INTRAVENOUS | Status: AC
  Administered 2024-05-01: 10 meq via INTRAVENOUS
  Filled 2024-05-01: qty 100

## 2024-05-01 MED ORDER — POLYETHYLENE GLYCOL 3350 17 G PO PACK: 17.0000 g | PACK | Freq: Every day | ORAL | Status: DC | PRN

## 2024-05-01 MED ORDER — OXYCODONE HCL 5 MG PO TABS
5.0000 mg | ORAL_TABLET | ORAL | Status: DC | PRN
  Administered 2024-05-01: 5 mg via ORAL
  Filled 2024-05-01: qty 1

## 2024-05-01 MED ORDER — DOCUSATE SODIUM 100 MG PO CAPS
100.0000 mg | ORAL_CAPSULE | Freq: Two times a day (BID) | ORAL | Status: DC
Start: 2024-05-01 — End: 2024-05-05
  Administered 2024-05-01 – 2024-05-05 (×8): 100 mg via ORAL
  Filled 2024-05-01 (×8): qty 1

## 2024-05-01 MED ORDER — ONDANSETRON 4 MG PO TBDP
ORAL_TABLET | ORAL | Status: AC
  Administered 2024-05-01: 4 mg
  Filled 2024-05-01: qty 1

## 2024-05-01 MED ORDER — ONDANSETRON 4 MG PO TBDP: 4.0000 mg | ORAL_TABLET | Freq: Four times a day (QID) | ORAL | Status: DC | PRN

## 2024-05-01 MED ORDER — IOHEXOL 350 MG/ML SOLN
50.0000 mL | Freq: Once | INTRAVENOUS | Status: AC | PRN
  Administered 2024-05-01: 50 mL

## 2024-05-01 MED ORDER — ACETAMINOPHEN 500 MG PO TABS
1000.0000 mg | ORAL_TABLET | Freq: Four times a day (QID) | ORAL | Status: DC
Start: 2024-05-01 — End: 2024-05-05
  Administered 2024-05-01 – 2024-05-05 (×11): 1000 mg via ORAL
  Filled 2024-05-01 (×11): qty 2
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
## 2024-05-01 NOTE — Progress Notes (Signed)
 Transition of Care [facility]) - CAGE-AID Screening   Patient Details  Name: Dylan Dominguez MRN: [id number] Date of Birth: 23-Dec-1995  Transition of Care [facility]) CM/SW Contact:    Bernardino Mayotte, RN Phone Number: 05/01/2024, 11:12 PM   Clinical Narrative:  Patient endorses alcohol use, denies need for resources.   CAGE-AID Screening:    Have You Ever Felt You Ought to Cut Down on Your Drinking or Drug Use?: No Have People Annoyed You By Critizing Your Drinking Or Drug Use?: No Have You Felt Bad Or Guilty About Your Drinking Or Drug Use?: No Have You Ever Had a Drink or Used Drugs First Thing In The Morning to Steady Your Nerves or to Get Rid of a Hangover?: No CAGE-AID Score: 0  Substance Abuse Education Offered: No

## 2024-05-01 NOTE — Progress Notes (Signed)
 Trauma Electrolyte Per Pharmacy Consult  Trauma electrolyte per pharmacy consult placed for patient. Phosphorous, potassium, and magnesium may be replaced by a pharmacist in ALL trauma ICU patients. Calcium may ONLY be replaced by a pharmacist in trauma ICU patients who received at least 2 units of packed red blood cells (pRBC) within a 24-hour period  Trauma patient being admitted to ICU: Yes  At least 2 units of packed red blood cells (pRBC) within a 24-hour period?: No  Key exclusion criteria present: Yes    TCTS, ECMO  SCr > 2 or increase > 0.5 mg/dL over 24 hours  GFR < 30  Weight < 40 kg  Renal replacement therapies such as intermittent hemodialysis (iHD), peritoneal dialysis (PD), or continuous renal replacement therapy (CRRT)   Patient has already received replacement for current lab   Calcium: N/A  Phosphorus: Not collected Plan: F/u labs  Potassium: 3.1  - 10 meq IV Kcl given in the ED - Potassium containing IVFs being started Plan: f/u labs in AM  Magnesium: Not collected Plan: F/u labs  For Myasthenia Gravis patients, contact provider before ordering magnesium replacement.     Dorn Buttner, PharmD, BCPS 05/01/2024 7:33 PM ED Clinical Pharmacist -  [phone]

## 2024-05-01 NOTE — ED Notes (Signed)
Pelvic binder in place
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
## 2024-05-01 NOTE — ED Notes (Signed)
 Patient transported to CT with TRN and primary RN on monitor
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
## 2024-05-01 NOTE — ED Notes (Signed)
 Called  [facility] [phone] [phone] for dr dasie.
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
## 2024-05-02 ENCOUNTER — Inpatient Hospital Stay (HOSPITAL_COMMUNITY)

## 2024-05-02 ENCOUNTER — Encounter (HOSPITAL_COMMUNITY): Admission: EM | Disposition: A | Discharge status: Home / Self Care | Payer: Self-pay | Source: Home / Self Care

## 2024-05-02 ENCOUNTER — Encounter (HOSPITAL_COMMUNITY): Payer: Self-pay

## 2024-05-02 ENCOUNTER — Other Ambulatory Visit: Payer: Self-pay

## 2024-05-02 DIAGNOSIS — S32811A Multiple fractures of pelvis with unstable disruption of pelvic ring, initial encounter for closed fracture: Secondary | ICD-10-CM

## 2024-05-02 DIAGNOSIS — S329XXA Fracture of unspecified parts of lumbosacral spine and pelvis, initial encounter for closed fracture: Secondary | ICD-10-CM | POA: Diagnosis not present

## 2024-05-02 HISTORY — PX: ORIF PELVIC FRACTURE: SHX2128

## 2024-05-02 LAB — CBC
HCT: 30.9 % — ABNORMAL LOW (ref 39.0–52.0)
HCT: 33.8 % — ABNORMAL LOW (ref 39.0–52.0)
Hemoglobin: 9.9 g/dL — ABNORMAL LOW (ref 13.0–17.0)
Hemoglobin: 10.8 g/dL — ABNORMAL LOW (ref 13.0–17.0)
MCH: 24.8 pg — ABNORMAL LOW (ref 26.0–34.0)
MCH: 24.5 pg — ABNORMAL LOW (ref 26.0–34.0)
MCHC: 32 g/dL (ref 30.0–36.0)
MCHC: 32 g/dL (ref 30.0–36.0)
MCV: 77.4 fL — ABNORMAL LOW (ref 80.0–100.0)
MCV: 76.6 fL — ABNORMAL LOW (ref 80.0–100.0)
Platelets: 136 K/uL — ABNORMAL LOW (ref 150–400)
Platelets: 135 K/uL — ABNORMAL LOW (ref 150–400)
RBC: 3.99 MIL/uL — ABNORMAL LOW (ref 4.22–5.81)
RBC: 4.41 MIL/uL (ref 4.22–5.81)
RDW: 15.6 % — ABNORMAL HIGH (ref 11.5–15.5)
RDW: 15.5 % (ref 11.5–15.5)
WBC: 11.4 K/uL — ABNORMAL HIGH (ref 4.0–10.5)
WBC: 12.5 K/uL — ABNORMAL HIGH (ref 4.0–10.5)
nRBC: 0 % (ref 0.0–0.2)
nRBC: 0 % (ref 0.0–0.2)

## 2024-05-02 LAB — BASIC METABOLIC PANEL WITH GFR
Anion gap: 11 (ref 5–15)
BUN: 11 mg/dL (ref 6–20)
CO2: 19 mmol/L — ABNORMAL LOW (ref 22–32)
Calcium: 8.6 mg/dL — ABNORMAL LOW (ref 8.9–10.3)
Chloride: 105 mmol/L (ref 98–111)
Creatinine, Ser: 0.86 mg/dL (ref 0.61–1.24)
GFR, Estimated: >60 mL/min (ref >60)
Glucose, Bld: 113 mg/dL — ABNORMAL HIGH (ref 70–99)
Potassium: 4.2 mmol/L (ref 3.5–5.1)
Sodium: 135 mmol/L (ref 135–145)

## 2024-05-02 LAB — SURGICAL PCR SCREEN
MRSA, PCR: NEGATIVE
Staphylococcus aureus: NEGATIVE

## 2024-05-02 LAB — ABO/RH: ABO/RH(D): O POS

## 2024-05-02 LAB — TYPE AND SCREEN
ABO/RH(D): O POS
Antibody Screen: NEGATIVE

## 2024-05-02 SURGERY — OPEN REDUCTION INTERNAL FIXATION (ORIF) PELVIC FRACTURE
Anesthesia: General

## 2024-05-02 MED ORDER — ROCURONIUM BROMIDE 10 MG/ML (PF) SYRINGE
PREFILLED_SYRINGE | INTRAVENOUS | Status: DC | PRN
  Administered 2024-05-02: 100 mg via INTRAVENOUS
  Administered 2024-05-02 (×2): 50 mg via INTRAVENOUS

## 2024-05-02 MED ORDER — EPHEDRINE 5 MG/ML INJ
INTRAVENOUS | Status: AC
  Filled 2024-05-02: qty 10

## 2024-05-02 MED ORDER — KETAMINE HCL 10 MG/ML IJ SOLN
INTRAMUSCULAR | Status: DC | PRN
  Administered 2024-05-02: 20 mg via INTRAVENOUS
  Administered 2024-05-02: 25 mg via INTRAVENOUS
  Administered 2024-05-02: 30 mg via INTRAVENOUS
  Administered 2024-05-02: 25 mg via INTRAVENOUS

## 2024-05-02 MED ORDER — PHENYLEPHRINE HCL-NACL 20-0.9 MG/250ML-% IV SOLN
INTRAVENOUS | Status: DC | PRN
  Administered 2024-05-02: 10 ug/min via INTRAVENOUS

## 2024-05-02 MED ORDER — FENTANYL CITRATE (PF) 250 MCG/5ML IJ SOLN
INTRAMUSCULAR | Status: AC
  Filled 2024-05-02: qty 5

## 2024-05-02 MED ORDER — DEXAMETHASONE SODIUM PHOSPHATE 10 MG/ML IJ SOLN
INTRAMUSCULAR | Status: AC
  Filled 2024-05-02: qty 1

## 2024-05-02 MED ORDER — DIPHENHYDRAMINE HCL 50 MG/ML IJ SOLN
INTRAMUSCULAR | Status: DC | PRN
  Administered 2024-05-02: 12.5 mg via INTRAVENOUS

## 2024-05-02 MED ORDER — FENTANYL CITRATE (PF) 100 MCG/2ML IJ SOLN
INTRAMUSCULAR | Status: AC
  Filled 2024-05-02: qty 2

## 2024-05-02 MED ORDER — PROPOFOL 10 MG/ML IV BOLUS
INTRAVENOUS | Status: DC | PRN
  Administered 2024-05-02: 200 mg via INTRAVENOUS
  Administered 2024-05-02: 50 mg via INTRAVENOUS

## 2024-05-02 MED ORDER — OXYCODONE HCL 5 MG PO TABS
5.0000 mg | ORAL_TABLET | ORAL | Status: DC | PRN
  Administered 2024-05-02: 10 mg via ORAL
  Administered 2024-05-03: 5 mg via ORAL
  Administered 2024-05-03 – 2024-05-05 (×7): 10 mg via ORAL
  Filled 2024-05-02: qty 1
  Filled 2024-05-02 (×8): qty 2

## 2024-05-02 MED ORDER — 0.9 % SODIUM CHLORIDE (POUR BTL) OPTIME
TOPICAL | Status: DC | PRN
  Administered 2024-05-02: 1000 mL

## 2024-05-02 MED ORDER — ROCURONIUM BROMIDE 10 MG/ML (PF) SYRINGE
PREFILLED_SYRINGE | INTRAVENOUS | Status: AC
  Filled 2024-05-02: qty 10

## 2024-05-02 MED ORDER — GLYCOPYRROLATE PF 0.2 MG/ML IJ SOSY
PREFILLED_SYRINGE | INTRAMUSCULAR | Status: DC | PRN
Start: 2024-05-02 — End: 2024-05-02
  Administered 2024-05-02: .1 mg via INTRAVENOUS

## 2024-05-02 MED ORDER — FENTANYL CITRATE (PF) 250 MCG/5ML IJ SOLN
INTRAMUSCULAR | Status: DC | PRN
  Administered 2024-05-02: 50 ug via INTRAVENOUS
  Administered 2024-05-02: 150 ug via INTRAVENOUS
  Administered 2024-05-02: 50 ug via INTRAVENOUS

## 2024-05-02 MED ORDER — DEXMEDETOMIDINE HCL IN NACL 80 MCG/20ML IV SOLN
INTRAVENOUS | Status: DC | PRN
Start: 2024-05-02 — End: 2024-05-02
  Administered 2024-05-02: 12 ug via INTRAVENOUS

## 2024-05-02 MED ORDER — ORAL CARE MOUTH RINSE: 15.0000 mL | Freq: Once | OROMUCOSAL | Status: AC

## 2024-05-02 MED ORDER — CHLORHEXIDINE GLUCONATE 0.12 % MT SOLN
15.0000 mL | Freq: Once | OROMUCOSAL | Status: AC
  Administered 2024-05-02: 15 mL via OROMUCOSAL

## 2024-05-02 MED ORDER — FENTANYL CITRATE (PF) 100 MCG/2ML IJ SOLN
25.0000 ug | INTRAMUSCULAR | Status: DC | PRN
  Administered 2024-05-02: 25 ug via INTRAVENOUS

## 2024-05-02 MED ORDER — LIDOCAINE 2% (20 MG/ML) 5 ML SYRINGE
INTRAMUSCULAR | Status: AC
  Filled 2024-05-02: qty 5

## 2024-05-02 MED ORDER — PHENYLEPHRINE 80 MCG/ML (10ML) SYRINGE FOR IV PUSH (FOR BLOOD PRESSURE SUPPORT)
PREFILLED_SYRINGE | INTRAVENOUS | Status: AC
  Filled 2024-05-02: qty 10

## 2024-05-02 MED ORDER — PROPOFOL 500 MG/50ML IV EMUL
INTRAVENOUS | Status: DC | PRN
  Administered 2024-05-02: 150 ug/kg/min via INTRAVENOUS

## 2024-05-02 MED ORDER — OXYCODONE HCL 5 MG/5ML PO SOLN: 5.0000 mg | Freq: Once | ORAL | Status: DC | PRN

## 2024-05-02 MED ORDER — KETAMINE HCL 50 MG/5ML IJ SOSY
PREFILLED_SYRINGE | INTRAMUSCULAR | Status: AC
  Filled 2024-05-02: qty 10

## 2024-05-02 MED ORDER — HYDROMORPHONE HCL 1 MG/ML IJ SOLN
INTRAMUSCULAR | Status: AC
  Filled 2024-05-02: qty 0.5

## 2024-05-02 MED ORDER — HYDROMORPHONE HCL 1 MG/ML IJ SOLN
INTRAMUSCULAR | Status: DC | PRN
  Administered 2024-05-02: .5 mg via INTRAVENOUS

## 2024-05-02 MED ORDER — DEXAMETHASONE SODIUM PHOSPHATE 10 MG/ML IJ SOLN
INTRAMUSCULAR | Status: DC | PRN
  Administered 2024-05-02: 10 mg via INTRAVENOUS

## 2024-05-02 MED ORDER — TRANEXAMIC ACID 1000 MG/10ML IV SOLN
INTRAVENOUS | Status: DC | PRN
Start: 2024-05-02 — End: 2024-05-02
  Administered 2024-05-02: 1000 mg via INTRAVENOUS

## 2024-05-02 MED ORDER — DIPHENHYDRAMINE HCL 50 MG/ML IJ SOLN
INTRAMUSCULAR | Status: AC
  Filled 2024-05-02: qty 1

## 2024-05-02 MED ORDER — MIDAZOLAM HCL 2 MG/2ML IJ SOLN
INTRAMUSCULAR | Status: AC
  Filled 2024-05-02: qty 2

## 2024-05-02 MED ORDER — ALBUMIN HUMAN 5 % IV SOLN: INTRAVENOUS | Status: DC | PRN

## 2024-05-02 MED ORDER — TRANEXAMIC ACID-NACL 1000-0.7 MG/100ML-% IV SOLN
INTRAVENOUS | Status: AC
  Filled 2024-05-02: qty 100

## 2024-05-02 MED ORDER — ACETAMINOPHEN 10 MG/ML IV SOLN
1000.0000 mg | Freq: Once | INTRAVENOUS | Status: DC | PRN
Start: 2024-05-02 — End: 2024-05-02

## 2024-05-02 MED ORDER — MIDAZOLAM HCL 2 MG/2ML IJ SOLN
INTRAMUSCULAR | Status: DC | PRN
  Administered 2024-05-02: 2 mg via INTRAVENOUS

## 2024-05-02 MED ORDER — LACTATED RINGERS IV SOLN: INTRAVENOUS | Status: DC

## 2024-05-02 MED ORDER — ONDANSETRON HCL 4 MG/2ML IJ SOLN
INTRAMUSCULAR | Status: AC
  Filled 2024-05-02: qty 2

## 2024-05-02 MED ORDER — ACETAMINOPHEN 10 MG/ML IV SOLN
INTRAVENOUS | Status: AC
  Filled 2024-05-02: qty 100

## 2024-05-02 MED ORDER — LACTATED RINGERS IV SOLN: INTRAVENOUS | Status: DC | PRN

## 2024-05-02 MED ORDER — PHENYLEPHRINE 80 MCG/ML (10ML) SYRINGE FOR IV PUSH (FOR BLOOD PRESSURE SUPPORT)
PREFILLED_SYRINGE | INTRAVENOUS | Status: DC | PRN
  Administered 2024-05-02: 160 ug via INTRAVENOUS
  Administered 2024-05-02 (×3): 80 ug via INTRAVENOUS
  Administered 2024-05-02: 160 ug via INTRAVENOUS
  Administered 2024-05-02: 80 ug via INTRAVENOUS

## 2024-05-02 MED ORDER — CHLORHEXIDINE GLUCONATE 0.12 % MT SOLN
OROMUCOSAL | Status: AC
  Filled 2024-05-02: qty 15

## 2024-05-02 MED ORDER — CEFAZOLIN SODIUM-DEXTROSE 2-4 GM/100ML-% IV SOLN
2.0000 g | Freq: Three times a day (TID) | INTRAVENOUS | Status: AC
  Administered 2024-05-02 – 2024-05-03 (×3): 2 g via INTRAVENOUS
  Filled 2024-05-02 (×3): qty 100

## 2024-05-02 MED ORDER — ONDANSETRON HCL 4 MG/2ML IJ SOLN
INTRAMUSCULAR | Status: AC
Start: 2024-05-02 — End: 2024-05-02
  Filled 2024-05-02: qty 2

## 2024-05-02 MED ORDER — OXYCODONE HCL 5 MG PO TABS: 5.0000 mg | ORAL_TABLET | Freq: Once | ORAL | Status: DC | PRN

## 2024-05-02 MED ORDER — SUGAMMADEX SODIUM 200 MG/2ML IV SOLN
INTRAVENOUS | Status: DC | PRN
  Administered 2024-05-02: 400 mg via INTRAVENOUS

## 2024-05-02 MED ORDER — LIDOCAINE 2% (20 MG/ML) 5 ML SYRINGE
INTRAMUSCULAR | Status: AC
Start: 2024-05-02 — End: 2024-05-02
  Filled 2024-05-02: qty 5

## 2024-05-02 MED ORDER — KETOROLAC TROMETHAMINE 30 MG/ML IJ SOLN
INTRAMUSCULAR | Status: AC
  Filled 2024-05-02: qty 1

## 2024-05-02 MED ORDER — LIDOCAINE 2% (20 MG/ML) 5 ML SYRINGE
INTRAMUSCULAR | Status: DC | PRN
  Administered 2024-05-02: 100 mg via INTRAVENOUS

## 2024-05-02 MED ORDER — DEXMEDETOMIDINE HCL IN NACL 80 MCG/20ML IV SOLN
INTRAVENOUS | Status: AC
Start: 2024-05-02 — End: 2024-05-02
  Filled 2024-05-02: qty 20

## 2024-05-02 MED ORDER — ONDANSETRON HCL 4 MG/2ML IJ SOLN
INTRAMUSCULAR | Status: DC | PRN
  Administered 2024-05-02: 4 mg via INTRAVENOUS

## 2024-05-02 MED ORDER — ACETAMINOPHEN 10 MG/ML IV SOLN
INTRAVENOUS | Status: DC | PRN
  Administered 2024-05-02: 1000 mg via INTRAVENOUS

## 2024-05-02 MED ORDER — DROPERIDOL 2.5 MG/ML IJ SOLN: 0.6250 mg | Freq: Once | INTRAMUSCULAR | Status: DC | PRN

## 2024-05-02 MED ORDER — PROPOFOL 10 MG/ML IV BOLUS
INTRAVENOUS | Status: AC
  Filled 2024-05-02: qty 20

## 2024-05-02 SURGICAL SUPPLY — 47 items
BAG COUNTER SPONGE SURGICOUNT (BAG) ×1 IMPLANT
BIT DRILL 4.8X300 (BIT) IMPLANT
BIT DRILL AO MATTA 2.5MX230M (BIT) IMPLANT
BLADE CLIPPER SURG (BLADE) IMPLANT
BRUSH SCRUB EZ PLAIN DRY (MISCELLANEOUS) ×2 IMPLANT
DRAPE C-ARM 42X72 X-RAY (DRAPES) ×1 IMPLANT
DRAPE C-ARMOR (DRAPES) ×1 IMPLANT
DRAPE LAPAROTOMY TRNSV 102X78 (DRAPES) ×1 IMPLANT
DRESSING MEPILEX FLEX 4X4 (GAUZE/BANDAGES/DRESSINGS) IMPLANT
DRSG MEPILEX POST OP 4X8 (GAUZE/BANDAGES/DRESSINGS) IMPLANT
ELECTRODE BLDE 4.0 EZ CLN MEGD (MISCELLANEOUS) IMPLANT
ELECTRODE REM PT RTRN 9FT ADLT (ELECTROSURGICAL) ×1 IMPLANT
GAUZE SPONGE 4X4 12PLY STRL (GAUZE/BANDAGES/DRESSINGS) IMPLANT
GLOVE BIO SURGEON STRL SZ7.5 (GLOVE) ×1 IMPLANT
GLOVE BIO SURGEON STRL SZ8 (GLOVE) ×1 IMPLANT
GLOVE BIOGEL PI IND STRL 7.5 (GLOVE) ×1 IMPLANT
GLOVE BIOGEL PI IND STRL 8 (GLOVE) ×1 IMPLANT
GOWN STRL REUS W/ TWL LRG LVL3 (GOWN DISPOSABLE) ×2 IMPLANT
GOWN STRL REUS W/ TWL XL LVL3 (GOWN DISPOSABLE) ×1 IMPLANT
KIT BASIN OR (CUSTOM PROCEDURE TRAY) ×1 IMPLANT
KIT TURNOVER KIT B (KITS) ×1 IMPLANT
NS IRRIG 1000ML POUR BTL (IV SOLUTION) ×2 IMPLANT
PACK TOTAL JOINT (CUSTOM PROCEDURE TRAY) ×1 IMPLANT
PAD ARMBOARD POSITIONER FOAM (MISCELLANEOUS) ×2 IMPLANT
PENCIL BUTTON HOLSTER BLD 10FT (ELECTRODE) IMPLANT
PIN FIX STIENMAN 2X230 (PIN) IMPLANT
PIN GUIDE DRIL TIP 2.8X300 STE (PIN) IMPLANT
PLATE SYMPHYSIS 92.5M 6H (Plate) IMPLANT
ROPE TRACTION 120 (SOFTGOODS) IMPLANT
SCREW CANN 8X135X40 (Screw) IMPLANT
SCREW CANN 8X140X40 (Screw) IMPLANT
SCREW CANN 8X160X40 (Screw) IMPLANT
SCREW CORTEX ST MATTA 3.5X24 (Screw) IMPLANT
SCREW CORTEX ST MATTA 3.5X28MM (Screw) IMPLANT
SCREW CORTEX ST MATTA 3.5X38M (Screw) IMPLANT
SCREW CORTEX ST MATTA 3.5X40MM (Screw) IMPLANT
SCREW CORTEX ST MATTA 3.5X50MM (Screw) IMPLANT
SPONGE T-LAP 18X18 ~~LOC~~+RFID (SPONGE) IMPLANT
STAPLER SKIN PROX 35W (STAPLE) ×1 IMPLANT
SUCTION TUBE FRAZIER 10FR DISP (SUCTIONS) ×1 IMPLANT
SUT ETHILON 2 0 PSLX (SUTURE) IMPLANT
SUT VIC AB 0 CT1 27XBRD ANBCTR (SUTURE) ×2 IMPLANT
SUT VIC AB 1 CT1 18XCR BRD 8 (SUTURE) ×2 IMPLANT
SUT VIC AB 2-0 CT1 TAPERPNT 27 (SUTURE) ×2 IMPLANT
TOWEL GREEN STERILE (TOWEL DISPOSABLE) ×2 IMPLANT
TOWEL GREEN STERILE FF (TOWEL DISPOSABLE) ×1 IMPLANT
WATER STERILE IRR 1000ML POUR (IV SOLUTION) ×4 IMPLANT
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
## 2024-05-02 NOTE — Op Note (Incomplete)
 05/02/2024 11:51 PM  PATIENT:  Dylan Dominguez  PRE-OPERATIVE DIAGNOSIS:  UNSTABLE PELVIC RING, ANTERIOR AND POSTERIOR   POST-OPERATIVE DIAGNOSIS:  UNSTABLE PELVIC RING, ANTERIOR AND POSTERIOR  PROCEDURE:  Procedure(s): OPEN REDUCTION INTERNAL FIXATION OF ANTERIOR PELVIC RING, RIGHT AND LEFT PERCUTANEOUS SACRO-ILIAC SCREW FIXATION, LEFT AND RIGHT(BILATERAL), OF THE POSTERIOR PELVIC RING  SURGEON:  Surgeon(s) and Role:    DEWAINE Celena Sharper, MD - Primary  PHYSICIAN ASSISTANT: Francis Mt, PA-C  ANESTHESIA:   general  EBL:  100 mL   BLOOD ADMINISTERED: NONE  DRAINS: NONE   LOCAL MEDICATIONS USED:  NONE  SPECIMEN:  No Specimen  DISPOSITION OF SPECIMEN:  N/A  COUNTS:  YES  TOURNIQUET:  * No tourniquets in log *  DICTATION: Note written in EPIC  PLAN OF CARE: Admit to inpatient   PATIENT DISPOSITION:  PACU - hemodynamically stable.   Delay start of Pharmacological VTE agent (>24hrs) due to surgical blood loss or risk of bleeding: no  BRIEF SUMMARY AND INDICATIONS FOR PROCEDURE:  Patient sustained an unstable pelvic ring injury with hemipelvis dislocation and proximal migration. Because of the nature and magnitude of these injuries, Dr. Sherida deemed further management outside his scope of practice and asserted these injuries would be best managed by fellowship trained orthopedic traumatologist.  Consequently, I was consulted to assume management.  The risks and benefits of the surgery were discussed with the patient including infection, nonunion, malunion, arthritis, loss of fixation, sexual dysfunction, pain, nerve injury, vessel injury, DVT, PE, and others.  We also specifically discussed urologic injury and footdrop and the probable need to place skeletal traction intraoperatively. After acknowledgement of these risks, consent was provided to proceed.   BRIEF SUMMARY OF PROCEDURE:  The patient was taken to the operating room, where general anesthesia was induced.  His legs  were positioned with a bump under the knees and held in close proximity. I was unable to reduce the fracture with traction alone and certainly could not maintain it. I then placed skeletal traction through the proximal tibia, secured the traction bow, and applied 35 pounds to obtain length required for reduction.  The abdomen and flank were cleaned with chlorhexidine  scrub and then Betadine scrub and paint.  Standard drape was then applied.    A time-out held and a Pfannenstiel incision made.  Dissection was carried down to the external abdominal muscles and the linea was divided midline.  This revealed complete disruption of the deep muscle insertion onto the pelvic brim.  A small volume of hematoma was removed. Little additional muscle removal off the pelvic brim was required on the left side.  After securing provisionally reduction with an anteriorly placed Weber tenaculum, a 6 hole anterior pelvic brim plate was contoured and then applied checking plate position with C-arm AP inlet and outlet views. Three bicortical screws were placed on each side of the symphysis and final AP inlet and outlet films showed appropriate reduction, hardware placement, trajectory and length.  Wound was irrigated thoroughly and then closed with figure-of-eight #1 Vicryl at the rectus insertion, 0 Vicryl, 2-0 Vicryl, and 2-0 nylon.  Sterile gently compressive dressing applied.  Francis Mt, PA-C, assisted me throughout and was necessary for deep retraction, reduction, and instrumentation.  Posteriorly, attention was turned first to the left SI joint.  A true lateral view of S1 vertebral body was used to obtain the correct starting point and trajectory. The guide pin was advanced through the iliac bone, across the left SI joint and into  the S1 vetebral body. Before advancing the pin we checked its position on the inlet to make sure that it was posterior to the ala and anterior to the canal. We checked the outlet to make  sure that it was superior to the S1 foramina and between the vertebral discs.  The guide pin was advanced. We then turned our attention to the right side of the posterior pelvis and further advanced the wire across the right ala, the SI joint, and to the outer cortex of the right ilium.  On this side we also checked  its position on the inlet to make sure that it was posterior to the ala and anterior to the canal, and the outlet to confirm superior to the S1 foramina and below the ala. The pin was measured for length, overdrilled, and then the screw with washer inserted. During both pin and screw placement my assistant manipulated the pelvis to assist reduction.  Final images showed appropriate placement and length across both sides of the pelvis.  An additional S2 screw was placed in similar manner, using the lateral for starting pint and trajectory. The pin was driven through the ilium, across one SI joint into the S2 vertebral body, and then driven across the opposite sacral body, SI joint, and ilium. We again used inlet and outlet views to carefully assume position in addition to final lateral view, as well. The screw with washer was then inserted after measuring and drilling. Excellent purchase was obtained and the screw was again carefully checked for length and trajectory using inlet, outlet, and lateral views.    Francis Mt, PA-C, again assisted with the reduction portion.  Wounds were irrigated thoroughly and closed in standard fashion with nylon. Sterile dressings were applied.    PROGNOSIS:  With regard to mobilization, the patient will be WBAT on the RLE for transfers and TDWB on the LLE for 8 wks.   Patient is at risk for SI joint arthrosis and pain. Patient will remain on the Trauma Service and may restart pharmacologic  DVT prophylaxis if no other contraindications.  Will follow closely.      Ozell DEL. Celena, M.D.
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
## 2024-05-02 NOTE — Anesthesia Procedure Notes (Signed)
 Procedure Name: Intubation Date/Time: 05/02/2024 2:38 PM  Performed by: Mollie Olivia SAUNDERS, CRNAPre-anesthesia Checklist: Patient identified, Emergency Drugs available, Suction available and Patient being monitored Patient Re-evaluated:Patient Re-evaluated prior to induction Oxygen Delivery Method: Circle system utilized Preoxygenation: Pre-oxygenation with 100% oxygen Induction Type: IV induction Ventilation: Mask ventilation without difficulty Laryngoscope Size: Mac and 4 Grade View: Grade II Tube type: Oral Tube size: 7.5 mm Number of attempts: 1 Airway Equipment and Method: Stylet and Oral airway Placement Confirmation: ETT inserted through vocal cords under direct vision, positive ETCO2 and breath sounds checked- equal and bilateral Secured at: 23 cm Tube secured with: Tape Dental Injury: Teeth and Oropharynx as per pre-operative assessment  Comments: Slightly anterior
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
## 2024-05-02 NOTE — Transfer of Care (Signed)
 Immediate Anesthesia Transfer of Care Note  Patient: Dylan Dominguez  Procedure(s) Performed: OPEN REDUCTION INTERNAL FIXATION (ORIF) PELVIC FRACTURE  Patient Location: PACU  Anesthesia Type:General  Level of Consciousness: sedated and drowsy  Airway & Oxygen Therapy: Patient Spontanous Breathing and Patient connected to face mask oxygen  Post-op Assessment: Report given to RN and Post -op Vital signs reviewed and stable  Post vital signs: Reviewed and stable  Last Vitals:  Vitals Value Taken Time  BP 112/60 05/02/24 18:15  Temp 36.6 C 05/02/24 17:52  Pulse 78 05/02/24 18:16  Resp 18 05/02/24 18:16  SpO2 95 % 05/02/24 18:16  Vitals shown include unfiled device data.  Last Pain:  Vitals:   05/02/24 1752  TempSrc:   PainSc: Asleep         Complications: No notable events documented.

## 2024-05-02 NOTE — Progress Notes (Signed)
 Received report from Lancie, RN and took over patient care at 1900. She reports she has called radiology regarding xray orders and radiology said they would get them when he got to his room on 4N. Patient still dozing in and out of sleep, but face is scrunched and patient is lowly groaning. When asked he reports 10/10 pain. Called Dr. Treen for PACU orders. After administering 25mcg of fentanyl , patient fell back asleep and face was relaxed and was no longer groaning. Patient's HR had been in 70s, but went to mid to upper 50s sinus brady. I informed Dr. Erma per PACU orders and he said he was fine with this and patient could go up to ICU, no new orders. Schuyler, RN received report and was there for bedside handoff.

## 2024-05-03 LAB — BASIC METABOLIC PANEL WITH GFR
Anion gap: 9 (ref 5–15)
BUN: 5 mg/dL — ABNORMAL LOW (ref 6–20)
CO2: 26 mmol/L (ref 22–32)
Calcium: 8.7 mg/dL — ABNORMAL LOW (ref 8.9–10.3)
Chloride: 104 mmol/L (ref 98–111)
Creatinine, Ser: 1.06 mg/dL (ref 0.61–1.24)
GFR, Estimated: >60 mL/min (ref >60)
Glucose, Bld: 152 mg/dL — ABNORMAL HIGH (ref 70–99)
Potassium: 4.3 mmol/L (ref 3.5–5.1)
Sodium: 139 mmol/L (ref 135–145)

## 2024-05-03 LAB — VITAMIN D 25 HYDROXY (VIT D DEFICIENCY, FRACTURES): Vit D, 25-Hydroxy: 17.23 ng/mL — ABNORMAL LOW (ref 30–100)

## 2024-05-03 LAB — CBC
HCT: 32.1 % — ABNORMAL LOW (ref 39.0–52.0)
Hemoglobin: 10.4 g/dL — ABNORMAL LOW (ref 13.0–17.0)
MCH: 24.6 pg — ABNORMAL LOW (ref 26.0–34.0)
MCHC: 32.4 g/dL (ref 30.0–36.0)
MCV: 76.1 fL — ABNORMAL LOW (ref 80.0–100.0)
Platelets: 133 K/uL — ABNORMAL LOW (ref 150–400)
RBC: 4.22 MIL/uL (ref 4.22–5.81)
RDW: 15.4 % (ref 11.5–15.5)
WBC: 13.1 K/uL — ABNORMAL HIGH (ref 4.0–10.5)
nRBC: 0 % (ref 0.0–0.2)

## 2024-05-03 LAB — LACTIC ACID, PLASMA: Lactic Acid, Venous: 1.3 mmol/L (ref 0.5–1.9)

## 2024-05-03 MED ORDER — ENOXAPARIN SODIUM 40 MG/0.4ML IJ SOSY
40.0000 mg | PREFILLED_SYRINGE | Freq: Every day | INTRAMUSCULAR | Status: DC
  Administered 2024-05-03 – 2024-05-05 (×3): 40 mg via SUBCUTANEOUS
  Filled 2024-05-03 (×3): qty 0.4

## 2024-05-03 NOTE — Progress Notes (Signed)
   05/03/24 1501  Vitals  Temp 98.6 F (37 C)  Temp Source Oral  BP 134/78  MAP (mmHg) 91  BP Location Left Arm  BP Method Automatic  Patient Position (if appropriate) Sitting  Pulse Rate 82  Pulse Rate Source Monitor  ECG Heart Rate 80  Resp 18  MEWS COLOR  MEWS Score Color Green  Oxygen Therapy  SpO2 99 %  O2 Device Room Air  MEWS Score  MEWS Temp 0  MEWS Systolic 0  MEWS Pulse 0  MEWS RR 0  MEWS LOC 0  MEWS Score 0   Pt admitted to 4NP 10 from 4N ICU. A&O x 4. Oriented to unit, sitting up in chair. Family at bedside.
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
## 2024-05-03 NOTE — Progress Notes (Signed)
 MRI Tspine reviewed - no thoracic spine injury  No activity, dietary, or medication restrictions from nsgy standpoint  Neurosurgery team to sign off. Thank you for allowing us  to participate in the care of this patient. Please do not hesitate to call us  with questions or concerns. No follow-up necessary

## 2024-05-03 NOTE — Anesthesia Postprocedure Evaluation (Signed)
 Anesthesia Post Note  Patient: Dylan Dominguez  Procedure(s) Performed: OPEN REDUCTION INTERNAL FIXATION (ORIF) PELVIC FRACTURE     Patient location during evaluation: PACU Anesthesia Type: General Level of consciousness: awake and alert Pain management: pain level controlled Vital Signs Assessment: post-procedure vital signs reviewed and stable Respiratory status: spontaneous breathing, nonlabored ventilation, respiratory function stable and patient connected to nasal cannula oxygen Cardiovascular status: blood pressure returned to baseline and stable Postop Assessment: no apparent nausea or vomiting Anesthetic complications: no   No notable events documented.  Last Vitals:  Vitals:   05/02/24 2300 05/03/24 0000  BP: 121/76   Pulse: 63   Resp: 12   Temp:  37.4 C  SpO2: 91%     Last Pain:  Vitals:   05/03/24 0000  TempSrc: Oral  PainSc:                  Thom JONELLE Peoples
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
## 2024-05-04 ENCOUNTER — Telehealth (HOSPITAL_COMMUNITY): Payer: Self-pay | Admitting: Pharmacy Technician

## 2024-05-04 ENCOUNTER — Other Ambulatory Visit (HOSPITAL_COMMUNITY): Payer: Self-pay

## 2024-05-04 LAB — CBC
HCT: 30.6 % — ABNORMAL LOW (ref 39.0–52.0)
Hemoglobin: 9.8 g/dL — ABNORMAL LOW (ref 13.0–17.0)
MCH: 24.4 pg — ABNORMAL LOW (ref 26.0–34.0)
MCHC: 32 g/dL (ref 30.0–36.0)
MCV: 76.1 fL — ABNORMAL LOW (ref 80.0–100.0)
Platelets: 118 K/uL — ABNORMAL LOW (ref 150–400)
RBC: 4.02 MIL/uL — ABNORMAL LOW (ref 4.22–5.81)
RDW: 14.8 % (ref 11.5–15.5)
WBC: 11.7 K/uL — ABNORMAL HIGH (ref 4.0–10.5)
nRBC: 0 % (ref 0.0–0.2)

## 2024-05-04 LAB — BASIC METABOLIC PANEL WITH GFR
Anion gap: 12 (ref 5–15)
BUN: 6 mg/dL (ref 6–20)
CO2: 26 mmol/L (ref 22–32)
Calcium: 8.6 mg/dL — ABNORMAL LOW (ref 8.9–10.3)
Chloride: 99 mmol/L (ref 98–111)
Creatinine, Ser: 0.97 mg/dL (ref 0.61–1.24)
GFR, Estimated: >60 mL/min (ref >60)
Glucose, Bld: 111 mg/dL — ABNORMAL HIGH (ref 70–99)
Potassium: 3.7 mmol/L (ref 3.5–5.1)
Sodium: 137 mmol/L (ref 135–145)

## 2024-05-04 MED ORDER — METHOCARBAMOL 500 MG PO TABS
1000.0000 mg | ORAL_TABLET | Freq: Three times a day (TID) | ORAL | Status: AC
  Administered 2024-05-04: 1000 mg via ORAL
  Filled 2024-05-04: qty 2

## 2024-05-04 MED ORDER — VITAMIN C 500 MG PO TABS
1000.0000 mg | ORAL_TABLET | Freq: Every day | ORAL | Status: DC
  Administered 2024-05-04 – 2024-05-05 (×2): 1000 mg via ORAL
  Filled 2024-05-04 (×2): qty 2

## 2024-05-04 MED ORDER — HYDROMORPHONE HCL 1 MG/ML IJ SOLN: 0.5000 mg | INTRAMUSCULAR | Status: DC | PRN

## 2024-05-04 MED ORDER — POLYETHYLENE GLYCOL 3350 17 G PO PACK
17.0000 g | PACK | Freq: Every day | ORAL | Status: DC
  Administered 2024-05-04 – 2024-05-05 (×2): 17 g via ORAL
  Filled 2024-05-04 (×2): qty 1

## 2024-05-04 MED ORDER — VITAMIN D 25 MCG (1000 UNIT) PO TABS
2000.0000 [IU] | ORAL_TABLET | Freq: Two times a day (BID) | ORAL | Status: DC
Start: 2024-05-04 — End: 2024-05-05
  Administered 2024-05-04 – 2024-05-05 (×3): 2000 [IU] via ORAL
  Filled 2024-05-04 (×3): qty 2
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
## 2024-05-04 NOTE — Plan of Care (Signed)

## 2024-05-04 NOTE — Telephone Encounter (Signed)
 Patient Product/process development scientist completed.    The patient is insured through [employer]. Patient has [employer], may use a copay card, and/or apply for patient assistance if available.    Ran test claim for Eliquis  2.5 mg and the current 30 day co-pay is $585.78 due to a $7500 deductible.   This test claim was processed through [facility]- copay amounts may vary at other pharmacies due to pharmacy/plan contracts, or as the patient moves through the different stages of their insurance plan.     Reyes Sharps, CPHT Pharmacy Technician III Certified Patient Advocate [facility] Patient Advocate Team Direct Number: [phone]  Fax: [phone]
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
## 2024-05-05 ENCOUNTER — Other Ambulatory Visit (HOSPITAL_COMMUNITY): Payer: Self-pay

## 2024-05-05 ENCOUNTER — Encounter (HOSPITAL_COMMUNITY): Payer: Self-pay | Admitting: Orthopedic Surgery

## 2024-05-05 DIAGNOSIS — F430 Acute stress reaction: Secondary | ICD-10-CM | POA: Diagnosis not present

## 2024-05-05 DIAGNOSIS — F419 Anxiety disorder, unspecified: Secondary | ICD-10-CM

## 2024-05-05 LAB — CBC
HCT: 29.9 % — ABNORMAL LOW (ref 39.0–52.0)
Hemoglobin: 9.6 g/dL — ABNORMAL LOW (ref 13.0–17.0)
MCH: 24.3 pg — ABNORMAL LOW (ref 26.0–34.0)
MCHC: 32.1 g/dL (ref 30.0–36.0)
MCV: 75.7 fL — ABNORMAL LOW (ref 80.0–100.0)
Platelets: 139 K/uL — ABNORMAL LOW (ref 150–400)
RBC: 3.95 MIL/uL — ABNORMAL LOW (ref 4.22–5.81)
RDW: 14.6 % (ref 11.5–15.5)
WBC: 9.4 K/uL (ref 4.0–10.5)
nRBC: 0 % (ref 0.0–0.2)

## 2024-05-05 MED ORDER — DOCUSATE SODIUM 100 MG PO CAPS
100.0000 mg | ORAL_CAPSULE | Freq: Two times a day (BID) | ORAL | 0 refills | Status: AC
  Filled 2024-05-05: qty 10, 5d supply, fill #0

## 2024-05-05 MED ORDER — VITAMIN D3 25 MCG PO TABS
2000.0000 [IU] | ORAL_TABLET | Freq: Two times a day (BID) | ORAL | 0 refills | Status: AC
  Filled 2024-05-05: qty 120, 30d supply, fill #0

## 2024-05-05 MED ORDER — POLYETHYLENE GLYCOL 3350 17 G PO PACK: 17.0000 g | PACK | Freq: Every day | ORAL | Status: AC | PRN

## 2024-05-05 MED ORDER — ACETAMINOPHEN 500 MG PO TABS: 1000.0000 mg | ORAL_TABLET | Freq: Four times a day (QID) | ORAL | Status: AC | PRN

## 2024-05-05 MED ORDER — APIXABAN 2.5 MG PO TABS
2.5000 mg | ORAL_TABLET | Freq: Two times a day (BID) | ORAL | 0 refills | Status: AC
  Filled 2024-05-05: qty 60, 30d supply, fill #0

## 2024-05-05 MED ORDER — OXYCODONE HCL 5 MG PO TABS
5.0000 mg | ORAL_TABLET | ORAL | 0 refills | Status: AC | PRN
  Filled 2024-05-05: qty 30, 5d supply, fill #0

## 2024-05-05 MED ORDER — ASCORBIC ACID 1000 MG PO TABS
1000.0000 mg | ORAL_TABLET | Freq: Every day | ORAL | 0 refills | Status: AC
  Filled 2024-05-05: qty 30, 30d supply, fill #0

## 2024-05-05 MED ORDER — GABAPENTIN 300 MG PO CAPS
300.0000 mg | ORAL_CAPSULE | Freq: Three times a day (TID) | ORAL | 0 refills | Status: AC
  Filled 2024-05-05: qty 90, 30d supply, fill #0

## 2024-05-05 NOTE — Progress Notes (Signed)
 Patient d/c to home with mother. D/c instructions explained to family and also wound care instructions were given. Patient was supplied with mepilex to take home. TOC medications were picked up. Patient nor family had any further questions. Patient was transferred into family care by nurse. Fredie LITTIE Morones, RN

## 2024-05-05 NOTE — Discharge Instructions (Addendum)
 Orthopaedic Discharge instructions     Weightbearing as tolerated  R leg for transfers x 8 weeks             Toe touch weightbearing L leg for x 8 weeks                     No ambulating for distances              Unrestricted ROM B hips, knees and ankles              Dressing changes as needed                         Mepilex (silicone foam dressing) or 4x4 gauze and tape   Ok to clean surgical wounds with soap and water only/ok to shower              Ice PRN              Scrotal support as needed
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
## 2024-05-05 NOTE — Plan of Care (Signed)

## 2024-05-05 NOTE — TOC Transition Note (Signed)
 Transition of Care [facility]) - Discharge Note   Patient Details  Name: Dylan Dominguez MRN: [id number] Date of Birth: 23-Mar-1996  Transition of Care [facility]) CM/SW Contact:  Mohamed Portlock M, RN Phone Number: 05/05/2024, 3:31 PM   Clinical Narrative:    Patient medically stable for discharge to mother's home with family members to assist with needed care.  Hospital bed to be delivered to mother's home this evening per patient.  He is comfortable with discharging home by private vehicle.  Outpatient rehab to be arranged per ortho once WB status updated.    Final next level of care: OP Rehab Barriers to Discharge: Barriers Resolved   Patient Goals and CMS Choice   CMS Medicare.gov Compare Post Acute Care list provided to:: Patient Choice offered to / list presented to : Patient                            Discharge Plan and Services Additional resources added to the After Visit Summary for     Discharge Planning Services: CM Consult            DME Arranged: Hospital bed DME Agency: [employer] Date DME Agency Contacted: 05/04/24 Time DME Agency Contacted: 1430 Representative spoke with at DME Agency: [place]            Social Drivers of Health (SDOH) Interventions SDOH Screenings   Food Insecurity: No Food Insecurity (05/04/2024)  Housing: Low Risk  (05/04/2024)  Transportation Needs: No Transportation Needs (05/04/2024)  Utilities: Not At Risk (05/04/2024)  Tobacco Use: High Risk (05/02/2024)     Readmission Risk Interventions    05/03/2024    1:30 PM  Readmission Risk Prevention Plan  Post Dischage Appt Complete  Medication Screening Complete  Transportation Screening Complete   Mliss MICAEL Fass, RN, BSN  Trauma/Neuro ICU Case Manager [phone]
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
## 2024-05-05 NOTE — Plan of Care (Signed)
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
## 2024-05-06 NOTE — Progress Notes (Signed)
  Progress Note   Date: 05/04/2024  Patient Name: Dylan Dominguez        MRN#: [id number]  Review the patient's clinical findings supports the diagnosis of:   Not Clinically Significant

## 2024-05-19 DIAGNOSIS — S32811D Multiple fractures of pelvis with unstable disruption of pelvic ring, subsequent encounter for fracture with routine healing: Secondary | ICD-10-CM | POA: Diagnosis not present

## 2024-06-09 DIAGNOSIS — S32811D Multiple fractures of pelvis with unstable disruption of pelvic ring, subsequent encounter for fracture with routine healing: Secondary | ICD-10-CM | POA: Diagnosis not present

## 2024-06-28 ENCOUNTER — Ambulatory Visit (INDEPENDENT_AMBULATORY_CARE_PROVIDER_SITE_OTHER): Admitting: Primary Care

## 2024-07-07 DIAGNOSIS — S32811D Multiple fractures of pelvis with unstable disruption of pelvic ring, subsequent encounter for fracture with routine healing: Secondary | ICD-10-CM | POA: Diagnosis not present
